# Patient Record
Sex: Female | Born: 1985 | Race: White | Hispanic: No | Marital: Married | Smoking: Former smoker
Health system: Southern US, Community
[De-identification: ages and names within clinical notes are randomized; demographics above are authoritative.]

## PROBLEM LIST (undated history)

## (undated) DIAGNOSIS — E079 Disorder of thyroid, unspecified: Secondary | ICD-10-CM

## (undated) DIAGNOSIS — Z30430 Encounter for insertion of intrauterine contraceptive device: Secondary | ICD-10-CM

## (undated) DIAGNOSIS — E119 Type 2 diabetes mellitus without complications: Secondary | ICD-10-CM

## (undated) HISTORY — PX: NO PAST SURGERIES: SHX2092

## (undated) HISTORY — DX: Encounter for insertion of intrauterine contraceptive device: Z30.430

---
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
## 2007-07-25 ENCOUNTER — Ambulatory Visit: Payer: Self-pay | Admitting: Internal Medicine

## 2008-01-18 ENCOUNTER — Ambulatory Visit: Payer: Self-pay | Admitting: Family Medicine

## 2008-08-05 ENCOUNTER — Ambulatory Visit: Payer: Self-pay | Admitting: Internal Medicine

## 2008-08-05 IMAGING — CR DG CHEST 2V
1 series · 2 of 2 positions shown · non-contrast
Comparison: none

REASON FOR EXAM: Persistent cough. > 2 weeks
COMMENTS:

[Series 1: view not recorded · 0.17mm/px · 2 of 2 slices shown]
[im 1/2]
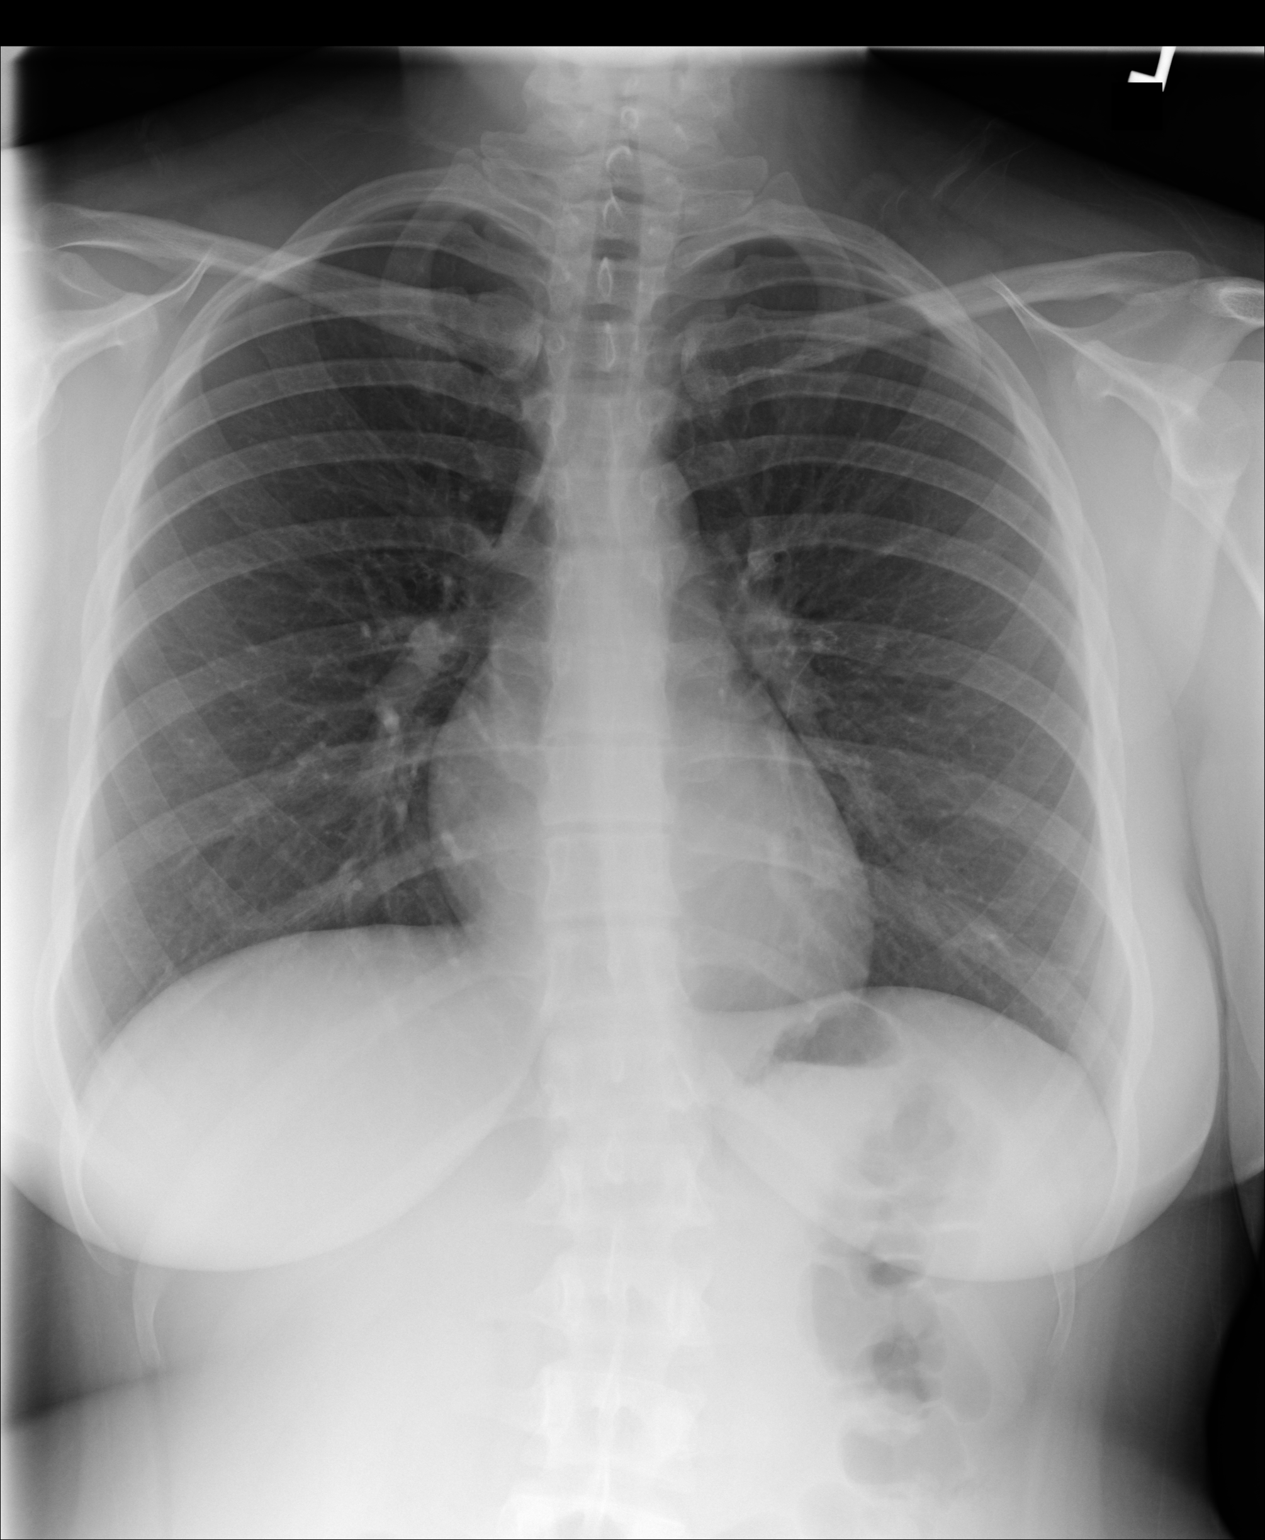
[im 2/2]
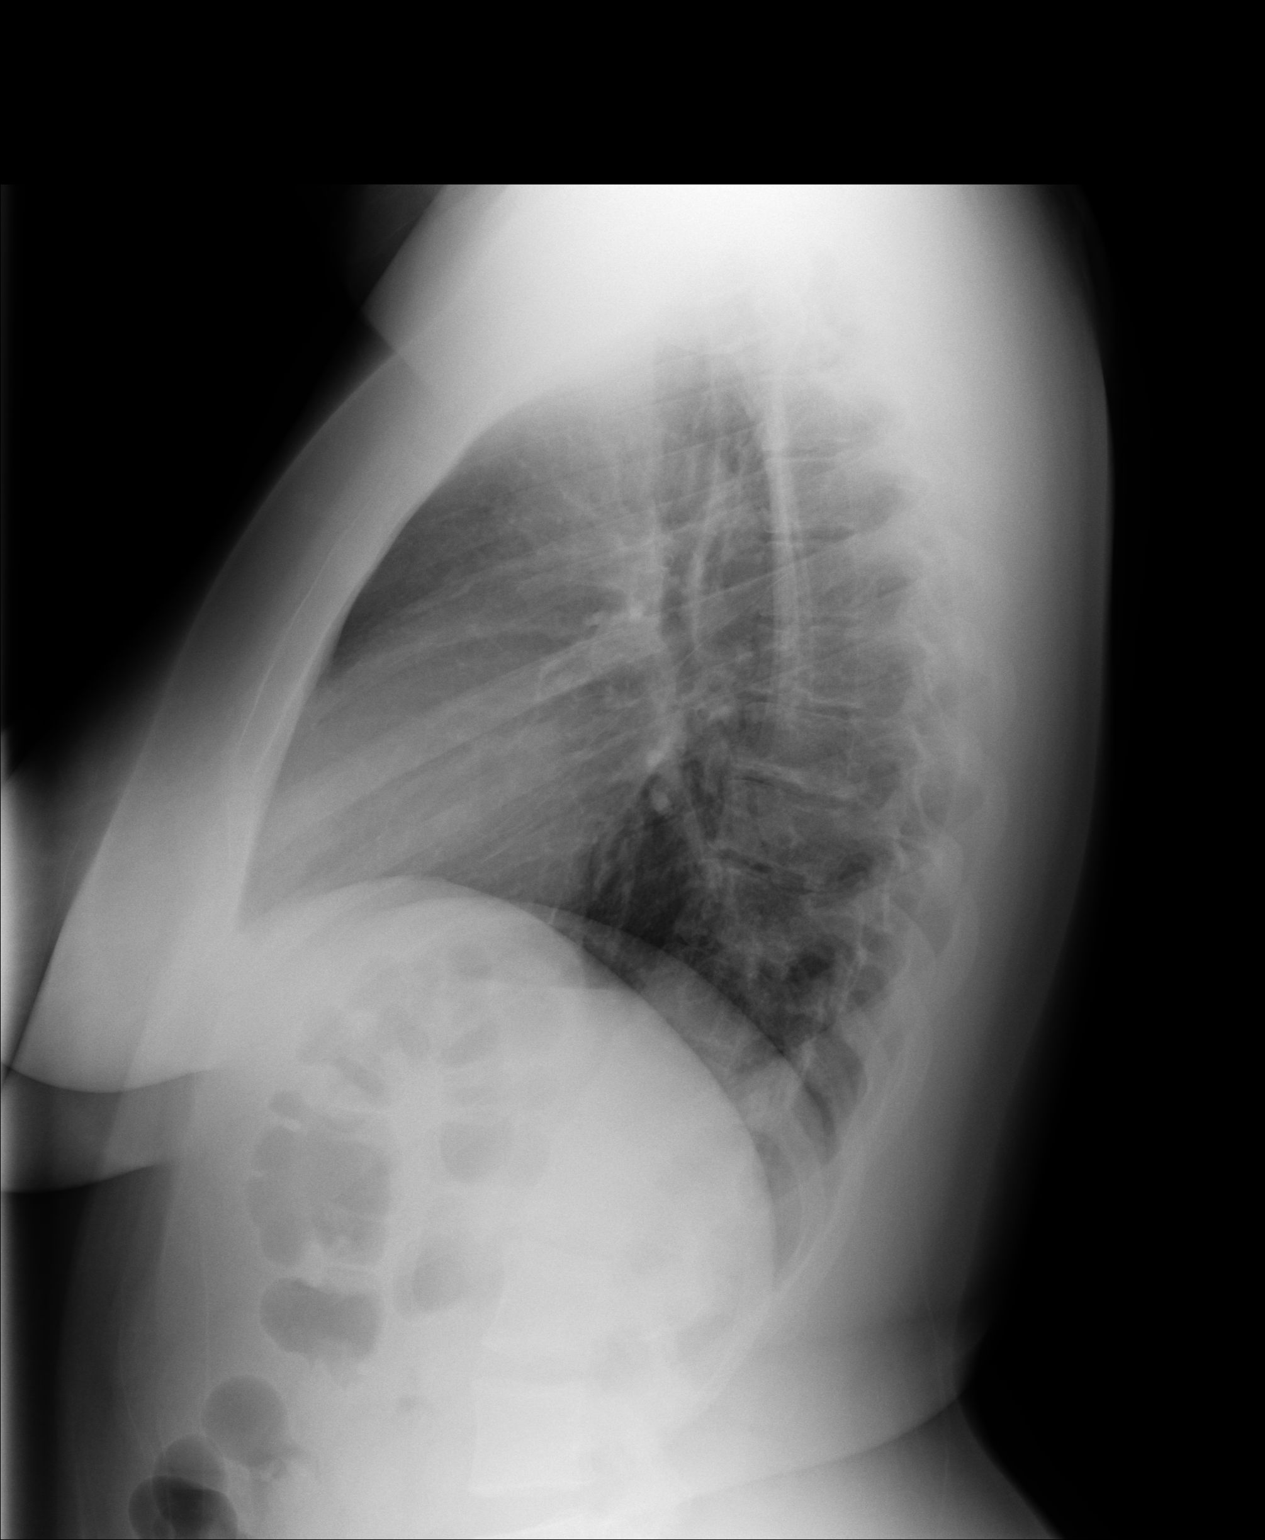

[2 of 2 positions shown; findings below may reference images not displayed]

PROCEDURE:     MDR - MDR CHEST PA(OR AP) AND LATERAL  - [DATE] [DATE]

RESULT:     There is no previous exam for comparison.

The lungs are clear. The heart and pulmonary vessels are normal. The bony
and mediastinal structures are unremarkable. There is no effusion. There is
no pneumothorax or evidence of congestive failure.
IMPRESSION: No acute cardiopulmonary disease.

## 2011-06-25 DIAGNOSIS — Z30430 Encounter for insertion of intrauterine contraceptive device: Secondary | ICD-10-CM

## 2011-06-25 HISTORY — DX: Encounter for insertion of intrauterine contraceptive device: Z30.430

## 2011-09-16 DIAGNOSIS — G43909 Migraine, unspecified, not intractable, without status migrainosus: Secondary | ICD-10-CM | POA: Insufficient documentation

## 2011-09-16 DIAGNOSIS — E119 Type 2 diabetes mellitus without complications: Secondary | ICD-10-CM | POA: Insufficient documentation

## 2011-09-16 DIAGNOSIS — E039 Hypothyroidism, unspecified: Secondary | ICD-10-CM | POA: Insufficient documentation

## 2011-09-16 DIAGNOSIS — K219 Gastro-esophageal reflux disease without esophagitis: Secondary | ICD-10-CM | POA: Insufficient documentation

## 2011-09-19 DIAGNOSIS — F909 Attention-deficit hyperactivity disorder, unspecified type: Secondary | ICD-10-CM | POA: Insufficient documentation

## 2013-04-13 ENCOUNTER — Emergency Department: Payer: Self-pay | Admitting: Emergency Medicine

## 2014-07-14 ENCOUNTER — Emergency Department: Payer: Self-pay | Admitting: Emergency Medicine

## 2014-07-14 LAB — COMPREHENSIVE METABOLIC PANEL
ANION GAP: 8 (ref 7–16)
AST: 17 U/L (ref 15–37)
Albumin: 3.6 g/dL (ref 3.4–5.0)
Alkaline Phosphatase: 101 U/L
BUN: 12 mg/dL (ref 7–18)
Bilirubin,Total: 0.3 mg/dL (ref 0.2–1.0)
CHLORIDE: 103 mmol/L (ref 98–107)
CO2: 24 mmol/L (ref 21–32)
Calcium, Total: 8.4 mg/dL — ABNORMAL LOW (ref 8.5–10.1)
Creatinine: 0.69 mg/dL (ref 0.60–1.30)
EGFR (African American): >60
EGFR (Non-African Amer.): >60
Glucose: 328 mg/dL — ABNORMAL HIGH (ref 65–99)
Osmolality: 283 (ref 275–301)
Potassium: 4 mmol/L (ref 3.5–5.1)
SGPT (ALT): 24 U/L
Sodium: 135 mmol/L — ABNORMAL LOW (ref 136–145)
Total Protein: 6.7 g/dL (ref 6.4–8.2)

## 2014-07-14 LAB — CBC WITH DIFFERENTIAL/PLATELET
BASOS ABS: 0.1 10*3/uL (ref 0.0–0.1)
BASOS PCT: 0.8 %
EOS ABS: 0.3 10*3/uL (ref 0.0–0.7)
Eosinophil %: 4.2 %
HCT: 42.3 % (ref 35.0–47.0)
HGB: 14.1 g/dL (ref 12.0–16.0)
Lymphocyte #: 2.4 10*3/uL (ref 1.0–3.6)
Lymphocyte %: 34.4 %
MCH: 31.8 pg (ref 26.0–34.0)
MCHC: 33.4 g/dL (ref 32.0–36.0)
MCV: 95 fL (ref 80–100)
MONO ABS: 0.9 x10 3/mm (ref 0.2–0.9)
Monocyte %: 12.4 %
NEUTROS PCT: 48.2 %
Neutrophil #: 3.4 10*3/uL (ref 1.4–6.5)
Platelet: 290 10*3/uL (ref 150–440)
RBC: 4.45 10*6/uL (ref 3.80–5.20)
RDW: 12.6 % (ref 11.5–14.5)
WBC: 7 10*3/uL (ref 3.6–11.0)

## 2014-07-14 LAB — URINALYSIS, COMPLETE
Bacteria: NONE SEEN
Bilirubin,UR: NEGATIVE
Blood: NEGATIVE
Glucose,UR: >=500 mg/dL (ref 0–75)
Leukocyte Esterase: NEGATIVE
NITRITE: NEGATIVE
PROTEIN: NEGATIVE
Ph: 6 (ref 4.5–8.0)
RBC,UR: 1 /HPF (ref 0–5)
Specific Gravity: 1.039 (ref 1.003–1.030)
Squamous Epithelial: 2
WBC UR: <1 /HPF (ref 0–5)

## 2014-07-16 LAB — BETA STREP CULTURE(ARMC)

## 2014-07-28 ENCOUNTER — Emergency Department: Payer: Self-pay | Admitting: Emergency Medicine

## 2016-06-20 ENCOUNTER — Ambulatory Visit (INDEPENDENT_AMBULATORY_CARE_PROVIDER_SITE_OTHER): Payer: BLUE CROSS/BLUE SHIELD

## 2016-06-20 ENCOUNTER — Encounter: Payer: Self-pay | Admitting: *Deleted

## 2016-06-20 ENCOUNTER — Ambulatory Visit
Admission: EM | Admit: 2016-06-20 | Discharge: 2016-06-20 | Disposition: A | Discharge status: Home / Self Care | Payer: BLUE CROSS/BLUE SHIELD

## 2016-06-20 DIAGNOSIS — R300 Dysuria: Secondary | ICD-10-CM | POA: Diagnosis not present

## 2016-06-20 DIAGNOSIS — M549 Dorsalgia, unspecified: Secondary | ICD-10-CM

## 2016-06-20 DIAGNOSIS — J40 Bronchitis, not specified as acute or chronic: Secondary | ICD-10-CM | POA: Diagnosis not present

## 2016-06-20 HISTORY — DX: Disorder of thyroid, unspecified: E07.9

## 2016-06-20 HISTORY — DX: Type 2 diabetes mellitus without complications: E11.9

## 2016-06-20 LAB — CBC WITH DIFFERENTIAL/PLATELET
Basophils Absolute: 0.1 10*3/uL (ref 0–0.1)
Basophils Relative: 1 %
EOS PCT: 3 %
Eosinophils Absolute: 0.2 10*3/uL (ref 0–0.7)
HCT: 43.4 % (ref 35.0–47.0)
Hemoglobin: 14.8 g/dL (ref 12.0–16.0)
LYMPHS ABS: 2.3 10*3/uL (ref 1.0–3.6)
LYMPHS PCT: 26 %
MCH: 31.7 pg (ref 26.0–34.0)
MCHC: 34.1 g/dL (ref 32.0–36.0)
MCV: 93 fL (ref 80.0–100.0)
MONO ABS: 0.7 10*3/uL (ref 0.2–0.9)
Monocytes Relative: 8 %
Neutro Abs: 5.5 10*3/uL (ref 1.4–6.5)
Neutrophils Relative %: 62 %
PLATELETS: 324 10*3/uL (ref 150–440)
RBC: 4.66 MIL/uL (ref 3.80–5.20)
RDW: 13 % (ref 11.5–14.5)
WBC: 8.8 10*3/uL (ref 3.6–11.0)

## 2016-06-20 LAB — URINALYSIS, COMPLETE (UACMP) WITH MICROSCOPIC
BILIRUBIN URINE: NEGATIVE
GLUCOSE, UA: 500 mg/dL — AB
KETONES UR: NEGATIVE mg/dL
Leukocytes, UA: NEGATIVE
Nitrite: NEGATIVE
PROTEIN: NEGATIVE mg/dL
Specific Gravity, Urine: 1.015 (ref 1.005–1.030)
pH: 6.5 (ref 5.0–8.0)

## 2016-06-20 LAB — BASIC METABOLIC PANEL
Anion gap: 9 (ref 5–15)
BUN: 10 mg/dL (ref 6–20)
CALCIUM: 9.4 mg/dL (ref 8.9–10.3)
CO2: 22 mmol/L (ref 22–32)
Chloride: 101 mmol/L (ref 101–111)
Creatinine, Ser: 0.61 mg/dL (ref 0.44–1.00)
GFR calc Af Amer: >60 mL/min (ref >60)
GLUCOSE: 206 mg/dL — AB (ref 65–99)
Potassium: 3.8 mmol/L (ref 3.5–5.1)
Sodium: 132 mmol/L — ABNORMAL LOW (ref 135–145)

## 2016-06-20 LAB — FIBRIN DERIVATIVES D-DIMER (ARMC ONLY): FIBRIN DERIVATIVES D-DIMER (ARMC): 120.63 (ref 0–499)

## 2016-06-20 LAB — RAPID STREP SCREEN (MED CTR MEBANE ONLY): Streptococcus, Group A Screen (Direct): NEGATIVE

## 2016-06-20 LAB — RAPID INFLUENZA A&B ANTIGENS
Influenza A (ARMC): NEGATIVE
Influenza B (ARMC): NEGATIVE

## 2016-06-20 LAB — PREGNANCY, URINE: Preg Test, Ur: NEGATIVE

## 2016-06-20 IMAGING — CR DG CHEST 2V
2 series · 2 of 2 positions shown · non-contrast
Comparison: [DATE]

CLINICAL DATA: Cough 1 month

EXAM:
CHEST  2 VIEW

[chest pa]
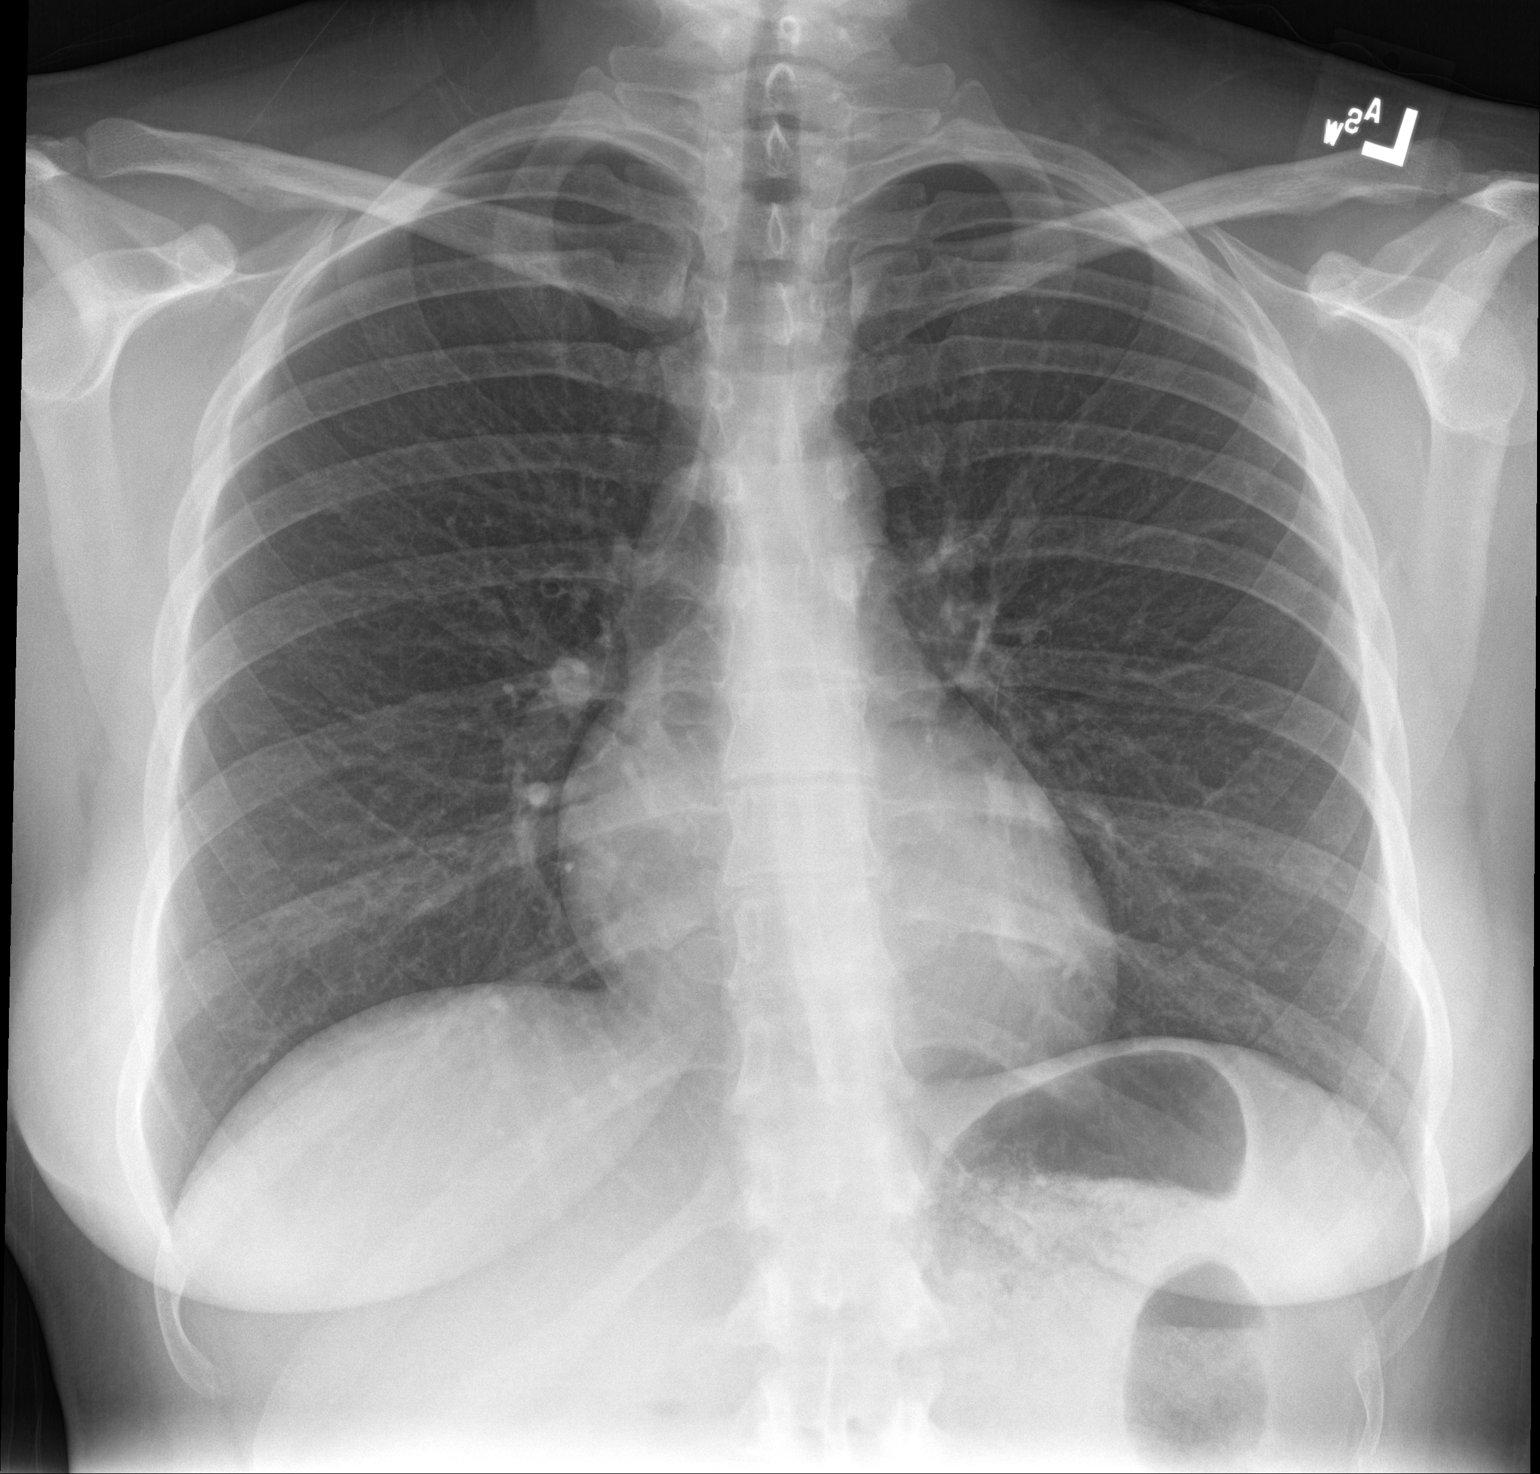

[chest lat]
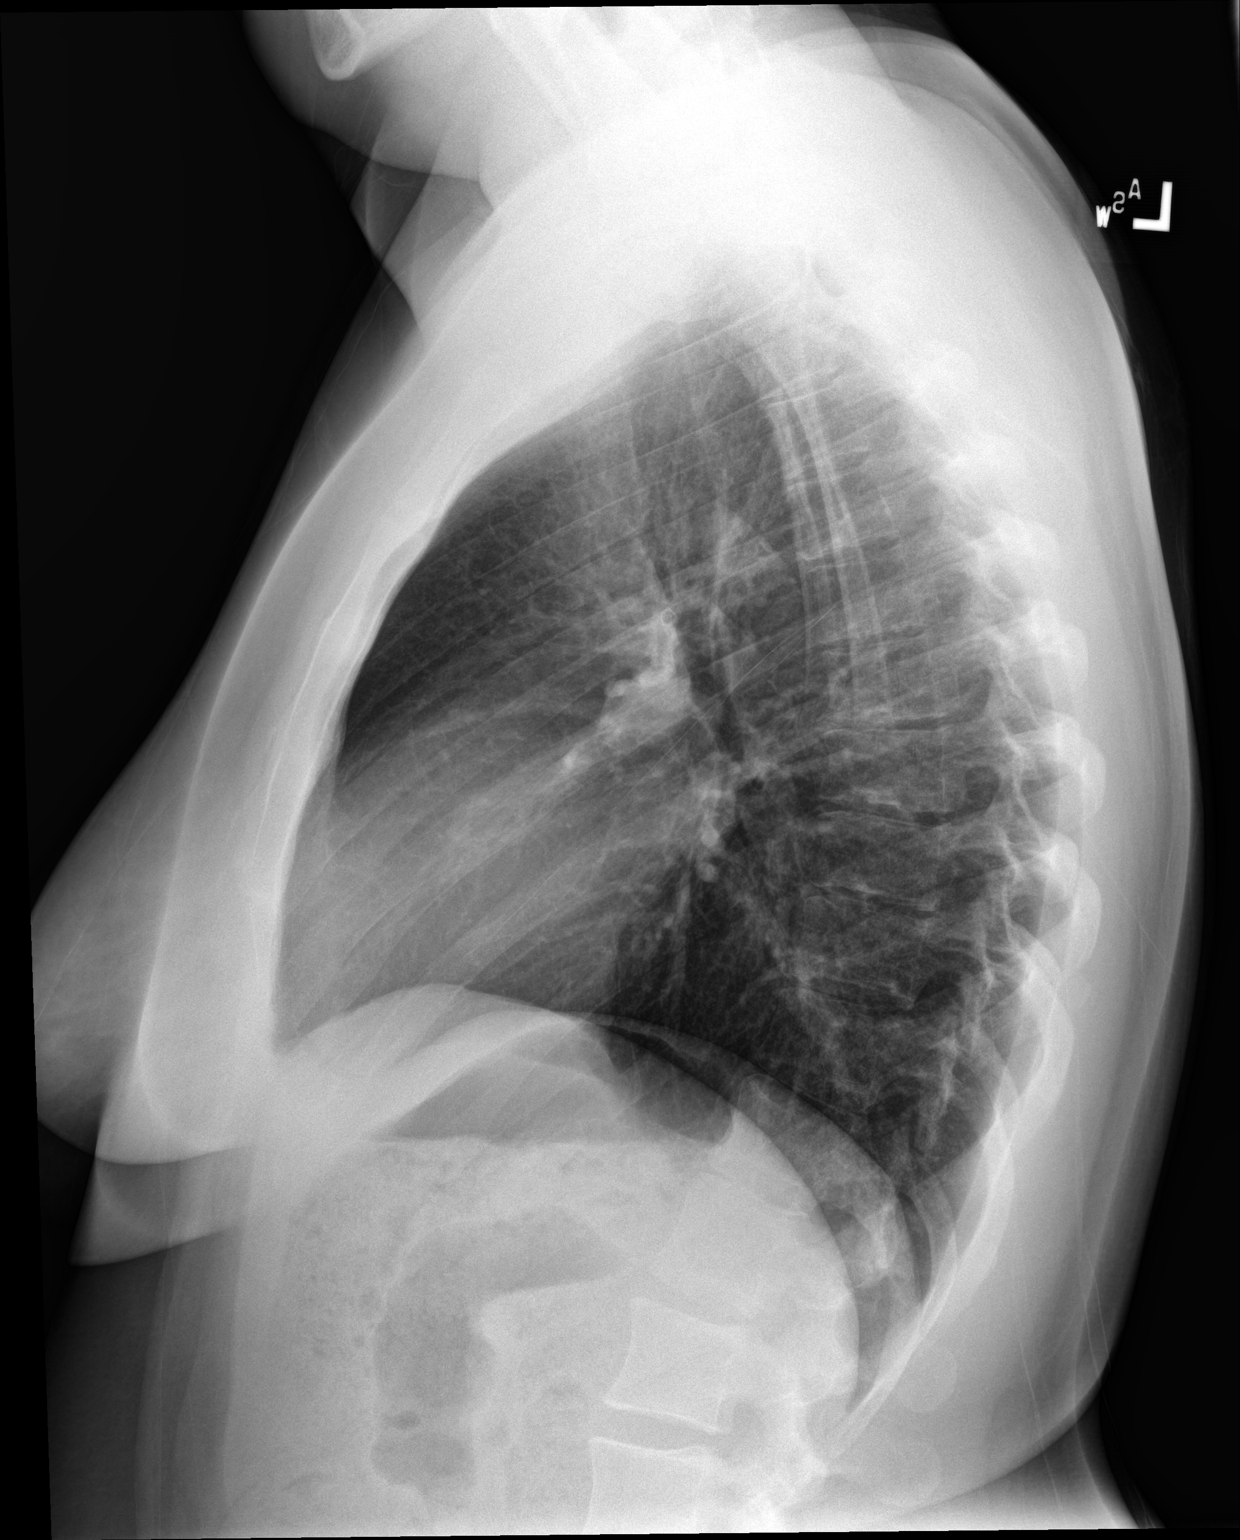

[2 of 2 positions shown; findings below may reference images not displayed]

FINDINGS: The heart size and mediastinal contours are within normal limits.
Both lungs are clear. The visualized skeletal structures are
unremarkable.
IMPRESSION: No active cardiopulmonary disease.

## 2016-06-20 MED ORDER — IPRATROPIUM-ALBUTEROL 0.5-2.5 (3) MG/3ML IN SOLN
3.0000 mL | Freq: Once | RESPIRATORY_TRACT | Status: AC
  Administered 2016-06-20: 3 mL via RESPIRATORY_TRACT

## 2016-06-20 MED ORDER — PREDNISONE 10 MG PO TABS: ORAL_TABLET | ORAL | 0 refills | Status: DC

## 2016-06-20 MED ORDER — ALBUTEROL SULFATE HFA 108 (90 BASE) MCG/ACT IN AERS: 2.0000 | INHALATION_SPRAY | RESPIRATORY_TRACT | 0 refills | Status: DC | PRN

## 2016-06-20 MED ORDER — SULFAMETHOXAZOLE-TRIMETHOPRIM 800-160 MG PO TABS: 1.0000 | ORAL_TABLET | Freq: Two times a day (BID) | ORAL | 0 refills | Status: AC

## 2016-06-20 MED ORDER — FLUCONAZOLE 150 MG PO TABS
150.0000 mg | ORAL_TABLET | Freq: Every day | ORAL | 0 refills | Status: DC
Start: 2016-06-20 — End: 2017-04-16

## 2016-06-20 MED ORDER — BENZONATATE 100 MG PO CAPS: 100.0000 mg | ORAL_CAPSULE | Freq: Three times a day (TID) | ORAL | 0 refills | Status: DC | PRN

## 2016-06-20 NOTE — Discharge Instructions (Signed)
Take medication as prescribed. Rest. Drink plenty of fluids.  ° °Follow up with your primary care physician this week. Return to Urgent care for new or worsening concerns.  ° °

## 2016-06-20 NOTE — ED Triage Notes (Signed)
Cough 1 month ago and seen at [facility] and had a CXR done, rx amoxicillin. Cough has persisted and symptoms have progressed. Now c/o productive cough- green, fever, back pain with coughing and resp., runny nose, head congestion, chills body aches.

## 2016-06-20 NOTE — ED Provider Notes (Signed)
[facility] ____________________________________________  Time seen: Approximately 4:08 PM  I have reviewed the triage vital signs and the nursing notes.   HISTORY  Chief Complaint Cough; Fever; and Back Pain  HPI  Melissa Terrell is a 30 y.o. female presents with mother at bedside for the complaints of 4 weeks of cough. Patient reports for the last 4 weeks she has had an ongoing cough and chest congestion with intermittent wheezing. Patient reports that she was initially seen by her primary doctor and diagnosed with bronchitis after approximately 1.5 weeks of symptoms, started on azithromycin. Patient reports symptoms have been continued and was seen at [facility] ER. Patient reports a that time in the ER she had a negative chest x-ray and was then sent home without any further medications. Patient reports cough has continued. Patient also reports for the last 3 days she has had increased nasal congestion, sore throat, chills and report of subjective low-grade fevers. Patient reports a the last week she has been exposed to family members that had the flu. Patient reports she feels that she is working with 2 separate illnesses.  Patient reports she has been having right-sided back pain for the last few weeks. Denies fall or injury. Denies change back pain with movement. States back pain is present with taking deep breaths but also at rest. Denies history of PE or DVT.   States cough is primarily a dry hacking cough with intermittent wheezing. Patient reports that she is a smoker and does have some intermittent wheezing when coughing. Denies current shortness of breath. Denies chest pain. Denies abdominal pain, dysuria, extremity pain, extremity swelling, dizziness, weakness or trauma. Denies recent surgeries, hospitalizations, long trips, or immobilization. Reports has an IUD. Declines chance of pregnancy.  [facility]: PCP   Past Medical History:  Diagnosis Date  .  Diabetes mellitus without complication (HCC)   . Thyroid disease   Patient reports type II diabetic. States that she was initially on metformin, but reports now only on insulin. Reports on Humalog. Patient reports blood sugars have been recently fluctuating, reports blood sugar yesterday before eating was 139.  There are no active problems to display for this patient.   History reviewed. No pertinent surgical history.  Current Outpatient Rx  . Order #: [id number] Class: Historical Med  . Order #: [id number] Class: Historical Med  . Order #: [id number] Class: Normal  . Order #: [id number] Class: Normal  . Order #: [id number] Class: Normal  . Order #: [id number] Class: Normal  . Order #: [id number] Class: Normal    No current facility-administered medications for this encounter.   Current Outpatient Prescriptions:  .  insulin lispro (HUMALOG) 100 UNIT/ML injection, Inject 5 Units into the skin 3 (three) times daily before meals., Disp: , Rfl:  .  levothyroxine (SYNTHROID, LEVOTHROID) 50 MCG tablet, Take 50 mcg by mouth daily before breakfast., Disp: , Rfl:  .  albuterol (PROVENTIL HFA;VENTOLIN HFA) 108 (90 Base) MCG/ACT inhaler, Inhale 2 puffs into the lungs every 4 (four) hours as needed., Disp: 1 Inhaler, Rfl: 0 .  benzonatate (TESSALON PERLES) 100 MG capsule, Take 1 capsule (100 mg total) by mouth 3 (three) times daily as needed., Disp: 15 capsule, Rfl: 0 .  fluconazole (DIFLUCAN) 150 MG tablet, Take 1 tablet (150 mg total) by mouth daily. Take one pill orally, then Repeat in 72 hours as needed., Disp: 2 tablet, Rfl: 0 .  predniSONE (DELTASONE) 10 MG tablet, Start 60 mg po day one, then 50 mg po day two, taper by  10 mg daily until complete., Disp: 21 tablet, Rfl: 0 .  sulfamethoxazole-trimethoprim (BACTRIM DS,SEPTRA DS) 800-160 MG tablet, Take 1 tablet by mouth 2 (two) times daily., Disp: 20 tablet, Rfl: 0  Allergies Penicillins  family history Grandfather: "heart history and blood clot in  heart"  Social History Social History  Substance Use Topics  . Smoking status: Current Every Day Smoker  . Smokeless tobacco: Never Used  . Alcohol use No    Review of Systems Constitutional: As above. Eyes: No visual changes. ENT:As above. Cardiovascular: Denies chest pain. Respiratory: Denies shortness of breath. Gastrointestinal: No abdominal pain.  No nausea, no vomiting.  No diarrhea.  No constipation. Genitourinary: Negative for dysuria. Musculoskeletal: Negative for back pain. Skin: Negative for rash. Neurological: Negative for headaches, focal weakness or numbness.  10-point ROS otherwise negative.  ____________________________________________   PHYSICAL EXAM:  VITAL SIGNS: ED Triage Vitals  Enc Vitals Group     BP 06/20/16 1517 130/70     Pulse Rate 06/20/16 1517 81     Resp 06/20/16 1517 16     Temp 06/20/16 1517 97.9 F (36.6 C)     Temp Source 06/20/16 1517 Oral     SpO2 06/20/16 1517 100 %     Weight 06/20/16 1518 159 lb (72.1 kg)     Height 06/20/16 1518 5\' 4"  (1.626 m)     Head Circumference --      Peak Flow --      Pain Score --      Pain Loc --      Pain Edu? --      Excl. in GC? --    Today's Vitals   06/20/16 1517 06/20/16 1518 06/20/16 1824 06/20/16 1825  BP: 130/70   110/76  Pulse: 81   84  Resp: 16     Temp: 97.9 F (36.6 C)     TempSrc: Oral     SpO2: 100%   99%  Weight:  159 lb (72.1 kg)    Height:  5\' 4"  (1.626 m)    PainSc:   8       Constitutional: Alert and oriented. Well appearing and in no acute distress. Eyes: Conjunctivae are normal. PERRL. EOMI. Head: Atraumatic. No sinus tenderness to palpation. No swelling. No erythema.  Ears: no erythema, normal TMs bilaterally.   Nose:Nasal congestion with clear rhinorrhea  Mouth/Throat: Mucous membranes are moist. Mild pharyngeal erythema. No tonsillar swelling or exudate.  Neck: No stridor.  No cervical spine tenderness to palpation. Hematological/Lymphatic/Immunilogical: No  cervical lymphadenopathy. Cardiovascular: Normal rate, regular rhythm. Grossly normal heart sounds.  Good peripheral circulation. Respiratory: Normal respiratory effort.  No retractions. Mild scattered rhonchi. No wheezes or rales. Good air movement. Dry intermittent cough noted in room with mild bronchospasm knees. Gastrointestinal: Soft and nontender. Normal Bowel sounds. No CVA tenderness. Musculoskeletal: No lower or upper extremity tenderness nor edema. No midline cervical, thoracic or lumbar tenderness to palpation. Patient does have mild tenderness to palpation right upper parathoracic with mild increased pain with twisting, no swelling, no ecchymosis. Neurologic:  Normal speech and language. No gait instability. Skin:  Skin is warm, dry and intact. No rash noted. Psychiatric: Mood and affect are normal. Speech and behavior are normal.   ___________________________________________   LABS (all labs ordered are listed, but only abnormal results are displayed)  Labs Reviewed  BASIC METABOLIC PANEL - Abnormal; Notable for the following:       Result Value   Sodium 132 (*)  Glucose, Bld 206 (*)    All other components within normal limits  URINALYSIS, COMPLETE (UACMP) WITH MICROSCOPIC - Abnormal; Notable for the following:    Glucose, UA 500 (*)    Hgb urine dipstick TRACE (*)    Squamous Epithelial / LPF 0-5 (*)    Bacteria, UA FEW (*)    All other components within normal limits  RAPID INFLUENZA A&B ANTIGENS ([facility] ONLY)  RAPID STREP SCREEN (NOT AT [facility])  CULTURE, GROUP A STREP ([facility])  URINE CULTURE  FIBRIN DERIVATIVES D-DIMER ([facility] ONLY)  CBC WITH DIFFERENTIAL/PLATELET  PREGNANCY, URINE    RADIOLOGY  Dg Chest 2 View  Result Date: 06/20/2016 CLINICAL DATA:  Cough 1 month EXAM: CHEST  2 VIEW COMPARISON:  08/05/2008 FINDINGS: The heart size and mediastinal contours are within normal limits. Both lungs are clear. The visualized skeletal structures are unremarkable.  IMPRESSION: No active cardiopulmonary disease. Electronically Signed   By: Marlan Palau M.D.   On: 06/20/2016 17:31   ____________________________________________   PROCEDURES Procedures     INITIAL IMPRESSION / ASSESSMENT AND PLAN / ED COURSE  Pertinent labs & imaging results that were available during my care of the patient were reviewed by me and considered in my medical decision making (see chart for details).  Overall well-appearing patient. No acute distress. Vital signs stable. No tachycardia or tachypnea. Patient with ongoing cough for the last several weeks, discussed concern for bronchitis, however patient is reporting pain with deep breath to right back discuss evaluation of PE. No bilateral lower extremity tenderness, swelling or appearance of DVT. Bilateral pedal pulses equal and easily palpated. No tachycardia, pulse ox 100%. Patient also reporting change of symptoms over the last several days after being exposed to influenza from family members. Discussed with patient in detail will evaluate for influenza, strep, CBC, BMP and d-dimer. Will defer chest x-ray at this time until d-dimer returned, as discussed with patient if d-dimer elevated patient will then be further evaluated and have CT of chest. Patient and mother verbalized understanding and agree to this plan.  Laboratory results reviewed with patient. The patient later reported that she felt like she was urinating more frequently with a slight burning sensation, evaluated urinalysis. Urinalysis showing few bacteria as well as yeast present. Discussed with patient monitoring glucose closely as possible contributor. Patient reports that she does use a sliding scale intermittently. Will culture urine, but concern for possible urinary tract infection. Chest x-ray negative for active cardiopulmonary disease per radiologist. As patient has can had continued cough with intermittent wheezing and rhonchi present, suspect bronchitis.  After neb, wheezes fully resolved, patient reports feeling better.  Encouraged stop smoking. Patient penicillin allergic, recent treatment with azithromycin, will treat patient with oral Bactrim to cover concern of UTI as well as bronchitis, prednisone taper, when necessary albuterol inhaler, Tessalon Perles and Diflucan for yeast noted. Discussed monitoring blood sugars closely. Reports good results in past with prednisone for similar symptoms. Encourage rest, fluids and discussed need for close PCP follow-up. Patient states that she will be following up with her primary care physician as scheduled tomorrow.  Discussed follow up with Primary care physician this week. Discussed follow up and return parameters including no resolution or any worsening concerns. Patient verbalized understanding and agreed to plan.   ____________________________________________   FINAL CLINICAL IMPRESSION(S) / ED DIAGNOSES  Final diagnoses:  Bronchitis  Dysuria  Acute right-sided back pain, unspecified back location     Discharge Medication List as of 06/20/2016  6:20  PM    START taking these medications   Details  albuterol (PROVENTIL HFA;VENTOLIN HFA) 108 (90 Base) MCG/ACT inhaler Inhale 2 puffs into the lungs every 4 (four) hours as needed., Starting Thu 06/20/2016, Normal    benzonatate (TESSALON PERLES) 100 MG capsule Take 1 capsule (100 mg total) by mouth 3 (three) times daily as needed., Starting Thu 06/20/2016, Normal    fluconazole (DIFLUCAN) 150 MG tablet Take 1 tablet (150 mg total) by mouth daily. Take one pill orally, then Repeat in 72 hours as needed., Starting Thu 06/20/2016, Normal    predniSONE (DELTASONE) 10 MG tablet Start 60 mg po day one, then 50 mg po day two, taper by 10 mg daily until complete., Normal    sulfamethoxazole-trimethoprim (BACTRIM DS,SEPTRA DS) 800-160 MG tablet Take 1 tablet by mouth 2 (two) times daily., Starting Thu 06/20/2016, Until Sun 06/30/2016, Normal         Note: This dictation was prepared with Dragon dictation along with smaller phrase technology. Any transcriptional errors that result from this process are unintentional.    Clinical Course       Renford Dills, NP 06/20/16 [phone]

## 2016-06-22 LAB — URINE CULTURE

## 2016-06-23 LAB — CULTURE, GROUP A STREP (THRC)

## 2017-04-16 ENCOUNTER — Encounter: Payer: Self-pay | Admitting: Maternal Newborn

## 2017-04-16 ENCOUNTER — Ambulatory Visit (INDEPENDENT_AMBULATORY_CARE_PROVIDER_SITE_OTHER): Payer: BLUE CROSS/BLUE SHIELD | Admitting: Maternal Newborn

## 2017-04-16 ENCOUNTER — Telehealth: Payer: Self-pay | Admitting: Advanced Practice Midwife

## 2017-04-16 VITALS — BP 110/62 | HR 92 | Ht 64.0 in | Wt 154.0 lb

## 2017-04-16 DIAGNOSIS — Z124 Encounter for screening for malignant neoplasm of cervix: Secondary | ICD-10-CM | POA: Diagnosis not present

## 2017-04-16 DIAGNOSIS — Z01419 Encounter for gynecological examination (general) (routine) without abnormal findings: Secondary | ICD-10-CM | POA: Diagnosis not present

## 2017-04-16 NOTE — Progress Notes (Signed)
Gynecology Annual Exam  PCP: [facility]  Chief Complaint:  Chief Complaint  Patient presents with  . Gynecologic Exam    History of Present Illness: Patient is a 31 y.o. G1P0101 presents for annual exam. The patient has no complaints today.   LMP: No LMP recorded. Patient is not currently having periods (Reason: IUD). Postcoital Bleeding: no Dysmenorrhea: no  The patient is sexually active. She currently uses IUD for contraception. She denies dyspareunia.  The patient occasionally performs self breast exams.  There is notable family history of breast or ovarian cancer in her family.  The patient wears seatbelts: yes.   The patient has regular exercise: yes.    The patient reports current symptoms of depression.    Review of Systems: Review of Systems  Constitutional: Negative for chills, fever and weight loss.  HENT: Negative for congestion, sinus pain and sore throat.   Eyes: Negative.   Respiratory: Positive for cough. Negative for shortness of breath and wheezing.   Cardiovascular: Negative for chest pain and palpitations.  Gastrointestinal: Positive for heartburn and nausea. Negative for abdominal pain and diarrhea.  Genitourinary: Negative for dysuria, frequency and urgency.  Skin: Negative.   Neurological: Negative for dizziness, sensory change and headaches.  Endo/Heme/Allergies: Negative.   Psychiatric/Behavioral: Positive for depression. Negative for substance abuse and suicidal ideas. The patient is nervous/anxious.   All other systems reviewed and are negative.   Past Medical History:  Past Medical History:  Diagnosis Date  . Diabetes mellitus without complication ([place])   . Encounter for insertion of mirena IUD 2013  . Thyroid disease     Past Surgical History:  History reviewed. No pertinent surgical history.  Gynecologic History:  No LMP recorded. Patient is not currently having periods (Reason: IUD). Contraception: IUD Last Pap: Patient  does not remember date, reports that results were: no abnormalities.  Obstetric History: G1P0101  Family History:  Family History  Problem Relation Age of Onset  . Ovarian cancer Other     Social History:  Social History   Social History  . Marital status: Married    Spouse name: N/A  . Number of children: N/A  . Years of education: N/A   Occupational History  . Not on file.   Social History Main Topics  . Smoking status: Current Every Day Smoker  . Smokeless tobacco: Never Used  . Alcohol use No  . Drug use: No  . Sexual activity: Yes    Birth control/ protection: IUD   Other Topics Concern  . Not on file   Social History Narrative  . No narrative on file    Allergies:  Allergies  Allergen Reactions  . Penicillins Anaphylaxis    Medications: Prior to Admission medications   Medication Sig Start Date End Date Taking? Authorizing Provider  Blood Glucose Monitoring Suppl (FIFTY50 GLUCOSE METER 2.0) w/Device KIT Use as instructed diabetes 250.00 04/05/14  Yes [provider]  escitalopram (LEXAPRO) 10 MG tablet TAKE 1/2 (ONE-HALF) TABLET BY MOUTH ONCE DAILY FOR 7 DAYS THEN  ONE  TABLET  DAILY  THEREAFTER 03/04/17  Yes [provider]  Insulin Glargine (LANTUS SOLOSTAR) 100 UNIT/ML Solostar Pen Inject into the skin. 03/31/17 03/31/18 Yes [provider]  insulin lispro (HUMALOG) 100 UNIT/ML injection Inject 5 Units into the skin 3 (three) times daily before meals.   Yes [provider]  Insulin Pen Needle (UNIFINE PENTIPS) 31G X 8 MM MISC Use for insulin pen 4 times daily E11.65  07/19/14  Yes [provider]  levonorgestrel (MIRENA) 20 MCG/24HR IUD Frequency:UNKNOWN   Dosage:0.0     Instructions:  Note:Dose: 1 10/15/11  Yes [provider]  levothyroxine (SYNTHROID, LEVOTHROID) 50 MCG tablet Take 50 mcg by mouth daily before breakfast.   Yes [provider]  metFORMIN (GLUCOPHAGE) 500 MG tablet Take 500 mg by  mouth. 03/31/17 03/31/18 Yes [provider]    Physical Exam Vitals: Blood pressure 110/62, pulse 92, height '5\' 4"'  (1.626 m), weight 154 lb (69.9 kg).  General: NAD HEENT: normocephalic, anicteric Thyroid: no enlargement, no palpable nodules Pulmonary: No increased work of breathing, CTAB Cardiovascular: RRR, distal pulses 2+ Breast: Breasts symmetrical, no tenderness, no palpable nodules or masses, no skin or nipple retraction present, no nipple discharge.  No axillary or supraclavicular lymphadenopathy. Small, nontender skin-colored nodule under left armpit that appears to be an ingrown hair. Abdomen: NABS, soft, non-tender, non-distended.  Umbilicus without lesions.  No hepatomegaly, splenomegaly or masses palpable. No evidence of hernia  Genitourinary:  External: Normal external female genitalia.  Normal urethral meatus, normal  Bartholin's and Skene's glands.    Vagina: Normal vaginal mucosa, no evidence of prolapse.    Cervix: Grossly normal in appearance, no bleeding, IUD strings visible.  Uterus: Non-enlarged, mobile, normal contour.  No CMT  Adnexa: ovaries non-enlarged, no adnexal masses  Rectal: deferred  Lymphatic: no evidence of inguinal lymphadenopathy Extremities: no edema, erythema, or tenderness Neurologic: Grossly intact Psychiatric: mood appropriate, affect full  PHQ-9: 3 GAD 7: 2  Assessment: 31 y.o. G1P0101 routine annual exam.  Plan: Problem List Items Addressed This Visit    Encounter for annual routine gynecological examination - Primary    Other Visit Diagnoses    Pap smear for cervical cancer screening       Relevant Orders   Pap IG w/ reflex to HPV when ASC-U      1) STI screening was offered and declined.  2) ASCCP guidelines and rationale discussed.  Patient opts for every 3 year screening interval.  3) Contraception - Education given regarding options for contraception. Patient desires Mirena IUD removal and reinsertion and will  schedule a visit for replacement.  4) Routine healthcare maintenance including cholesterol, diabetes screening discussed: managed by PCP.  5) Anxiety/depression: stable on Lexapro and does not feel she needs further interventions at this time.  6) Follow up 1 year for routine annual exam.  Avel Sensor, CNM 04/16/2017  3:33 PM

## 2017-04-16 NOTE — Telephone Encounter (Signed)
11/12 at 10:30 for mirena removal and insert with JEG

## 2017-04-18 LAB — PAP IG W/ RFLX HPV ASCU: PAP SMEAR COMMENT: 0

## 2017-04-22 NOTE — Telephone Encounter (Signed)
Noted. Will order by apt date/time.

## 2017-05-05 ENCOUNTER — Ambulatory Visit (INDEPENDENT_AMBULATORY_CARE_PROVIDER_SITE_OTHER): Payer: BLUE CROSS/BLUE SHIELD | Admitting: Advanced Practice Midwife

## 2017-05-05 ENCOUNTER — Encounter: Payer: Self-pay | Admitting: Advanced Practice Midwife

## 2017-05-05 VITALS — BP 114/74 | Ht 64.0 in | Wt 154.0 lb

## 2017-05-05 DIAGNOSIS — Z30433 Encounter for removal and reinsertion of intrauterine contraceptive device: Secondary | ICD-10-CM

## 2017-05-05 NOTE — Progress Notes (Signed)
    GYNECOLOGY OFFICE PROCEDURE NOTE  Melissa Terrell is a 31 y.o. G1P0101 here for IUD removal and reinsertion. The patient currently has a Mirena IUD placed in 2013, which will be replaced with a Mirena IUD today.  No GYN concerns.  Last pap smear was on 04/16/2017 and was normal.  IUD Removal and Reinsertion  Patient identified, informed consent performed, consent signed.   Discussed risks of irregular bleeding, cramping, infection, malpositioning or uterine perforation of the IUD which may require further procedures. Time out was performed. Speculum placed in the vagina. The strings of the IUD were grasped and pulled using ring forceps. The IUD was successfully removed in its entirety. The cervix was cleaned with Betadine x 2 and grasped anteriorly with a single tooth tenaculum.  A uterine sound was used to sound the uterus to 6 cm.  The IUD was then placed per manufacturer's recommendations. Strings trimmed to 3 cm. Tenaculum was removed, good hemostasis noted. Patient tolerated procedure well.   Patient was given post-procedure instructions.  Patient was also asked to check IUD strings periodically and follow up in 4 weeks for IUD check.   Melissa Terrell, CNM  [facility], [facility]  IUD insertion CPT [phone],  Christean GriefSkyla [id number] Mirena [phone] Penni BombardLiletta [phone] Paraguard [id number] IUD remval [id number] Modifer 25, plus Modifer 79 is done during a global billing visit

## 2017-05-07 NOTE — Telephone Encounter (Signed)
Mirena stock was provided for this patient. 

## 2017-06-20 ENCOUNTER — Ambulatory Visit: Payer: BLUE CROSS/BLUE SHIELD | Admitting: Obstetrics and Gynecology

## 2018-07-21 ENCOUNTER — Ambulatory Visit
Admission: EM | Admit: 2018-07-21 | Discharge: 2018-07-21 | Disposition: A | Discharge status: Home / Self Care | Payer: Medicaid Other | Attending: Family Medicine | Admitting: Family Medicine

## 2018-07-21 ENCOUNTER — Other Ambulatory Visit: Payer: Self-pay

## 2018-07-21 DIAGNOSIS — J069 Acute upper respiratory infection, unspecified: Secondary | ICD-10-CM | POA: Diagnosis not present

## 2018-07-21 DIAGNOSIS — Z720 Tobacco use: Secondary | ICD-10-CM | POA: Diagnosis not present

## 2018-07-21 MED ORDER — BENZONATATE 200 MG PO CAPS: ORAL_CAPSULE | ORAL | 0 refills | Status: DC

## 2018-07-21 MED ORDER — ALBUTEROL SULFATE HFA 108 (90 BASE) MCG/ACT IN AERS: 1.0000 | INHALATION_SPRAY | Freq: Four times a day (QID) | RESPIRATORY_TRACT | 0 refills | Status: DC | PRN

## 2018-07-21 MED ORDER — GUAIFENESIN-CODEINE 100-10 MG/5ML PO SYRP: 5.0000 mL | ORAL_SOLUTION | Freq: Three times a day (TID) | ORAL | 0 refills | Status: DC | PRN

## 2018-07-21 MED ORDER — CETIRIZINE-PSEUDOEPHEDRINE ER 5-120 MG PO TB12: 1.0000 | ORAL_TABLET | Freq: Every day | ORAL | 0 refills | Status: DC

## 2018-07-21 NOTE — ED Triage Notes (Signed)
Patient complains of cough, chest congestion x 1 week.

## 2018-07-21 NOTE — ED Provider Notes (Signed)
[facility]    CSN: [id number] Arrival date & time: 07/21/18  1825     History   Chief Complaint Chief Complaint  Patient presents with  . Cough    HPI [place] is a 33 y.o. female.   HPI  33 year old female presents with cough and chest congestion that she has had for over a week.  She has not had fever or chills.  She has experienced shortness of breath.  Pain has been severe enough to cause her to almost gag and vomit.  States that the household has had flu and other symptoms very similar to what she has now.  She is a current smoker.  She also has a history of diabetes.        Past Medical History:  Diagnosis Date  . Diabetes mellitus without complication ([place])   . Encounter for insertion of mirena IUD 2013  . Thyroid disease     Patient Active Problem List   Diagnosis Date Noted  . Encounter for annual routine gynecological examination 04/16/2017  . Attention deficit hyperactivity disorder (ADHD) 09/19/2011  . Diabetes mellitus ([place]) 09/16/2011  . Hypothyroidism 09/16/2011    Past Surgical History:  Procedure Laterality Date  . NO PAST SURGERIES      OB History    Gravida  1   Para  1   Term      Preterm  1   AB      Living  1     SAB      TAB      Ectopic      Multiple      Live Births  1            Home Medications    Prior to Admission medications   Medication Sig Start Date End Date Taking? Authorizing Provider  Blood Glucose Monitoring Suppl (FIFTY50 GLUCOSE METER 2.0) w/Device KIT Use as instructed diabetes 250.00 04/05/14  Yes [provider]  insulin aspart (NOVOLOG) 100 UNIT/ML FlexPen Inject into the skin. 03/31/17 07/21/18 Yes [provider]  Insulin Glargine (LANTUS SOLOSTAR) 100 UNIT/ML Solostar Pen Inject into the skin. 03/31/17 07/21/18 Yes [provider]  levonorgestrel (MIRENA) 20 MCG/24HR IUD Frequency:UNKNOWN   Dosage:0.0     Instructions:  Note:Dose: 1 10/15/11   Yes [provider]  levothyroxine (SYNTHROID, LEVOTHROID) 50 MCG tablet Take 50 mcg by mouth daily before breakfast.   Yes [provider]  metFORMIN (GLUCOPHAGE) 500 MG tablet Take 500 mg by mouth. 03/31/17 07/21/18 Yes [provider]  albuterol (PROVENTIL HFA;VENTOLIN HFA) 108 (90 Base) MCG/ACT inhaler Inhale 1-2 puffs into the lungs every 6 (six) hours as needed for wheezing or shortness of breath. Use with spacer 07/21/18   Lorin Picket, PA-C  benzonatate (TESSALON) 200 MG capsule Take one cap TID PRN cough 07/21/18   Crecencio Mc P, PA-C  cetirizine-pseudoephedrine (ZYRTEC-D) 5-120 MG tablet Take 1 tablet by mouth daily. 07/21/18   Lorin Picket, PA-C  guaiFENesin-codeine (CHERATUSSIN AC) 100-10 MG/5ML syrup Take 5 mLs by mouth 3 (three) times daily as needed for cough. 07/21/18   Lorin Picket, PA-C  Insulin Pen Needle (UNIFINE PENTIPS) 31G X 8 MM MISC Use for insulin pen 4 times daily E11.65 07/19/14   [provider]    Family History Family History  Problem Relation Age of Onset  . Ovarian cancer Other   . Diabetes Mother   . Heart disease Father  Social History Social History   Tobacco Use  . Smoking status: Current Every Day Smoker    Packs/day: 0.50    Types: Cigarettes  . Smokeless tobacco: Never Used  Substance Use Topics  . Alcohol use: No  . Drug use: No     Allergies   Penicillins   Review of Systems Review of Systems  Constitutional: Positive for activity change, chills and fatigue. Negative for appetite change and fever.  HENT: Positive for congestion, postnasal drip and rhinorrhea.   Respiratory: Positive for cough and shortness of breath.   All other systems reviewed and are negative.    Physical Exam Triage Vital Signs ED Triage Vitals  Enc Vitals Group     BP 07/21/18 1856 115/80     Pulse Rate 07/21/18 1856 75     Resp 07/21/18 1856 16     Temp 07/21/18 1856 98.7 F (37.1 C)     Temp Source  07/21/18 1856 Oral     SpO2 07/21/18 1856 100 %     Weight 07/21/18 1853 152 lb (68.9 kg)     Height 07/21/18 1853 '5\' 4"'  (1.626 m)     Head Circumference --      Peak Flow --      Pain Score 07/21/18 1853 0     Pain Loc --      Pain Edu? --      Excl. in [place]? --    No data found.  Updated Vital Signs BP 115/80 (BP Location: Left Arm)   Pulse 75   Temp 98.7 F (37.1 C) (Oral)   Resp 16   Ht '5\' 4"'  (1.626 m)   Wt 152 lb (68.9 kg)   SpO2 100%   BMI 26.09 kg/m   Visual Acuity Right Eye Distance:   Left Eye Distance:   Bilateral Distance:    Right Eye Near:   Left Eye Near:    Bilateral Near:     Physical Exam Vitals signs and nursing note reviewed.  Constitutional:      General: She is not in acute distress.    Appearance: Normal appearance. She is not ill-appearing, toxic-appearing or diaphoretic.  HENT:     Head: Normocephalic.     Right Ear: Tympanic membrane, ear canal and external ear normal.     Left Ear: Tympanic membrane, ear canal and external ear normal.     Nose: Congestion and rhinorrhea present.     Mouth/Throat:     Mouth: Mucous membranes are moist.     Pharynx: Oropharynx is clear. No oropharyngeal exudate or posterior oropharyngeal erythema.  Eyes:     General:        Right eye: No discharge.        Left eye: No discharge.     Conjunctiva/sclera: Conjunctivae normal.  Neck:     Musculoskeletal: Normal range of motion and neck supple.  Pulmonary:     Effort: Pulmonary effort is normal.     Breath sounds: Normal breath sounds.  Musculoskeletal: Normal range of motion.  Lymphadenopathy:     Cervical: No cervical adenopathy.  Skin:    General: Skin is warm and dry.  Neurological:     General: No focal deficit present.     Mental Status: She is alert and oriented to person, place, and time.  Psychiatric:        Mood and Affect: Mood normal.        Behavior: Behavior normal.  Thought Content: Thought content normal.        Judgment:  Judgment normal.      UC Treatments / Results  Labs (all labs ordered are listed, but only abnormal results are displayed) Labs Reviewed - No data to display  EKG None  Radiology No results found.  Procedures Procedures (including critical care time)  Medications Ordered in UC Medications - No data to display  Initial Impression / Assessment and Plan / UC Course  I have reviewed the triage vital signs and the nursing notes.  Pertinent labs & imaging results that were available during my care of the patient were reviewed by me and considered in my medical decision making (see chart for details).   Has an upper respiratory infection.  It viral does not require antibiotics.  Provide her with cough suppressant with Tessalon Perles and Cheratussin Cough Syrup for Nighttime Use.  Provide Her with Albuterol Shortness of Breath and Take the for Her Nasal Congestion.  She Does Not like Using Nasal Sprays.  Not Improving She Should Follow-Up with Her Primary Care Physician   Final Clinical Impressions(s) / UC Diagnoses   Final diagnoses:  Upper respiratory tract infection, unspecified type   Discharge Instructions   None    ED Prescriptions    Medication Sig Dispense Auth. Provider   albuterol (PROVENTIL HFA;VENTOLIN HFA) 108 (90 Base) MCG/ACT inhaler Inhale 1-2 puffs into the lungs every 6 (six) hours as needed for wheezing or shortness of breath. Use with spacer 1 Inhaler Lorin Picket, PA-C   guaiFENesin-codeine (CHERATUSSIN AC) 100-10 MG/5ML syrup Take 5 mLs by mouth 3 (three) times daily as needed for cough. 120 mL Crecencio Mc P, PA-C   benzonatate (TESSALON) 200 MG capsule Take one cap TID PRN cough 30 capsule Crecencio Mc P, PA-C   cetirizine-pseudoephedrine (ZYRTEC-D) 5-120 MG tablet Take 1 tablet by mouth daily. 30 tablet Lorin Picket, PA-C     Controlled Substance Prescriptions [place] Controlled Substance Registry consulted? Not Applicable   Lorin Picket, PA-C 07/21/18 2144

## 2018-08-21 DIAGNOSIS — F419 Anxiety disorder, unspecified: Secondary | ICD-10-CM | POA: Insufficient documentation

## 2018-08-21 DIAGNOSIS — L409 Psoriasis, unspecified: Secondary | ICD-10-CM | POA: Insufficient documentation

## 2018-10-26 ENCOUNTER — Ambulatory Visit: Payer: Managed Care, Other (non HMO) | Admitting: Dietician

## 2018-12-07 ENCOUNTER — Ambulatory Visit: Payer: Managed Care, Other (non HMO) | Admitting: Dietician

## 2018-12-14 ENCOUNTER — Ambulatory Visit: Payer: Managed Care, Other (non HMO) | Admitting: Dietician

## 2019-04-28 ENCOUNTER — Other Ambulatory Visit: Payer: Self-pay

## 2019-04-28 DIAGNOSIS — Z20822 Contact with and (suspected) exposure to covid-19: Secondary | ICD-10-CM

## 2019-04-29 LAB — NOVEL CORONAVIRUS, NAA: SARS-CoV-2, NAA: NOT DETECTED

## 2020-03-08 ENCOUNTER — Other Ambulatory Visit: Payer: Self-pay

## 2020-03-08 ENCOUNTER — Other Ambulatory Visit: Payer: Managed Care, Other (non HMO)

## 2020-03-08 DIAGNOSIS — Z20822 Contact with and (suspected) exposure to covid-19: Secondary | ICD-10-CM

## 2020-03-10 LAB — SARS-COV-2, NAA 2 DAY TAT

## 2020-03-10 LAB — NOVEL CORONAVIRUS, NAA: SARS-CoV-2, NAA: NOT DETECTED

## 2020-04-05 ENCOUNTER — Ambulatory Visit (INDEPENDENT_AMBULATORY_CARE_PROVIDER_SITE_OTHER): Payer: Medicaid Other

## 2020-04-05 ENCOUNTER — Other Ambulatory Visit: Payer: Self-pay

## 2020-04-05 ENCOUNTER — Ambulatory Visit
Admission: RE | Admit: 2020-04-05 | Discharge: 2020-04-05 | Disposition: A | Discharge status: Home / Self Care | Payer: Medicaid Other | Source: Ambulatory Visit | Attending: Emergency Medicine | Admitting: Emergency Medicine

## 2020-04-05 VITALS — BP 113/77 | HR 77 | Temp 98.1°F | Resp 16 | Ht 64.0 in | Wt 180.0 lb

## 2020-04-05 DIAGNOSIS — H9201 Otalgia, right ear: Secondary | ICD-10-CM | POA: Diagnosis not present

## 2020-04-05 DIAGNOSIS — M792 Neuralgia and neuritis, unspecified: Secondary | ICD-10-CM | POA: Diagnosis not present

## 2020-04-05 DIAGNOSIS — G500 Trigeminal neuralgia: Secondary | ICD-10-CM | POA: Diagnosis not present

## 2020-04-05 DIAGNOSIS — M26621 Arthralgia of right temporomandibular joint: Secondary | ICD-10-CM | POA: Insufficient documentation

## 2020-04-05 DIAGNOSIS — H6690 Otitis media, unspecified, unspecified ear: Secondary | ICD-10-CM | POA: Diagnosis not present

## 2020-04-05 LAB — COMPREHENSIVE METABOLIC PANEL
ALT: 10 U/L (ref 0–44)
AST: 14 U/L — ABNORMAL LOW (ref 15–41)
Albumin: 4.1 g/dL (ref 3.5–5.0)
Alkaline Phosphatase: 62 U/L (ref 38–126)
Anion gap: 8 (ref 5–15)
BUN: 12 mg/dL (ref 6–20)
CO2: 22 mmol/L (ref 22–32)
Calcium: 8.8 mg/dL — ABNORMAL LOW (ref 8.9–10.3)
Chloride: 101 mmol/L (ref 98–111)
Creatinine, Ser: 0.72 mg/dL (ref 0.44–1.00)
GFR, Estimated: >60 mL/min (ref >60)
Glucose, Bld: 304 mg/dL — ABNORMAL HIGH (ref 70–99)
Potassium: 4.3 mmol/L (ref 3.5–5.1)
Sodium: 131 mmol/L — ABNORMAL LOW (ref 135–145)
Total Bilirubin: 0.4 mg/dL (ref 0.3–1.2)
Total Protein: 7.2 g/dL (ref 6.5–8.1)

## 2020-04-05 LAB — CBC WITH DIFFERENTIAL/PLATELET
Abs Immature Granulocytes: 0.01 10*3/uL (ref 0.00–0.07)
Basophils Absolute: 0 10*3/uL (ref 0.0–0.1)
Basophils Relative: 1 %
Eosinophils Absolute: 0.2 10*3/uL (ref 0.0–0.5)
Eosinophils Relative: 2 %
HCT: 40.8 % (ref 36.0–46.0)
Hemoglobin: 14 g/dL (ref 12.0–15.0)
Immature Granulocytes: 0 %
Lymphocytes Relative: 27 %
Lymphs Abs: 2.4 10*3/uL (ref 0.7–4.0)
MCH: 31.2 pg (ref 26.0–34.0)
MCHC: 34.3 g/dL (ref 30.0–36.0)
MCV: 90.9 fL (ref 80.0–100.0)
Monocytes Absolute: 0.6 10*3/uL (ref 0.1–1.0)
Monocytes Relative: 7 %
Neutro Abs: 5.5 10*3/uL (ref 1.7–7.7)
Neutrophils Relative %: 63 %
Platelets: 394 10*3/uL (ref 150–400)
RBC: 4.49 MIL/uL (ref 3.87–5.11)
RDW: 12.2 % (ref 11.5–15.5)
WBC: 8.7 10*3/uL (ref 4.0–10.5)
nRBC: 0 % (ref 0.0–0.2)

## 2020-04-05 LAB — C-REACTIVE PROTEIN: CRP: 0.6 mg/dL (ref <1.0)

## 2020-04-05 IMAGING — CT CT MAXILLOFACIAL W/ CM
1 series · 15 of 30 positions shown, 19 images · IV contrast (omnipaque)
Comparison: None.

CLINICAL DATA: Otitis externa 1 month ago after swimming.
Persistent pain on the right. Now with abnormal facial nerve
sensations on the right.

EXAM:
CT MAXILLOFACIAL WITH CONTRAST
TECHNIQUE: Multidetector CT imaging of the maxillofacial structures was
performed with intravenous contrast. Multiplanar CT image
reconstructions were also generated.
CONTRAST:  75mL OMNIPAQUE IOHEXOL 300 MG/ML  SOLN

[Series 2: max soft · axial · 0.39mm/px · z∈[-334,-152]mm · 15 of 99 slices shown, 19 images]
[im 4/99  brain]
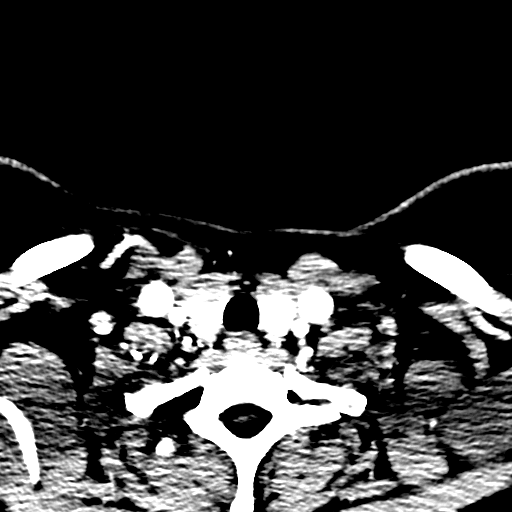
[im 4/99  bone]
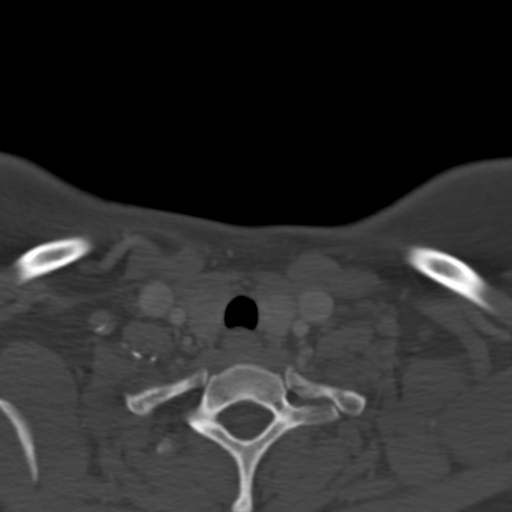
[im 11/99  bone]
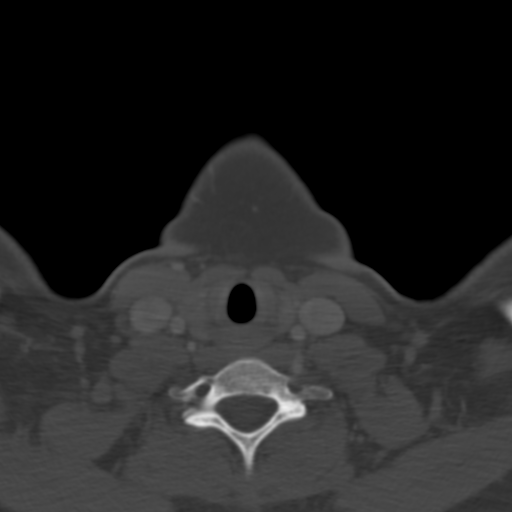
[im 17/99  bone]
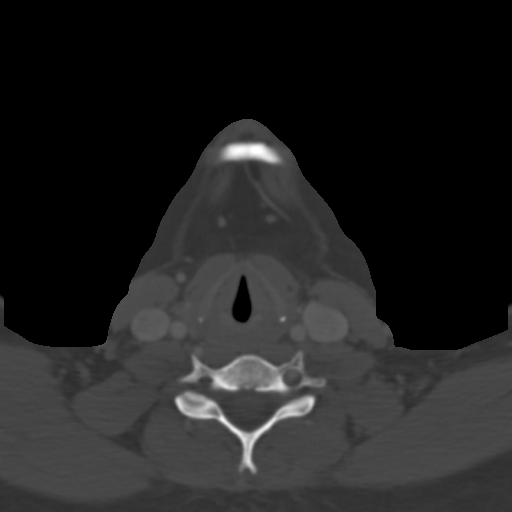
[im 24/99  bone]
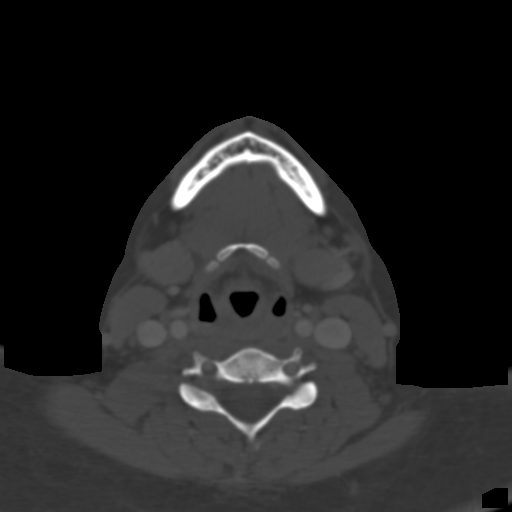
[im 31/99  brain]
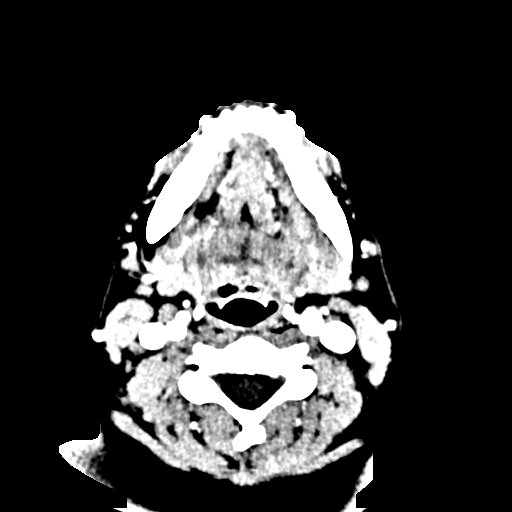
[im 31/99  bone]
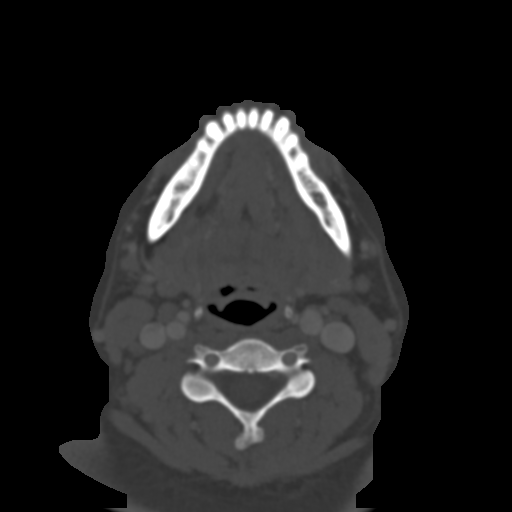
[im 38/99  bone]
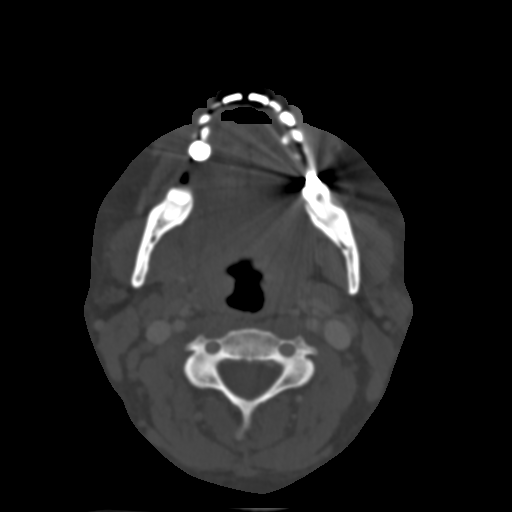
[im 44/99  bone]
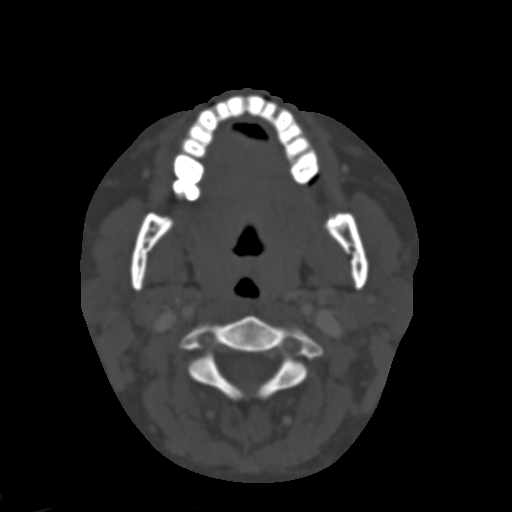
[im 51/99  bone]
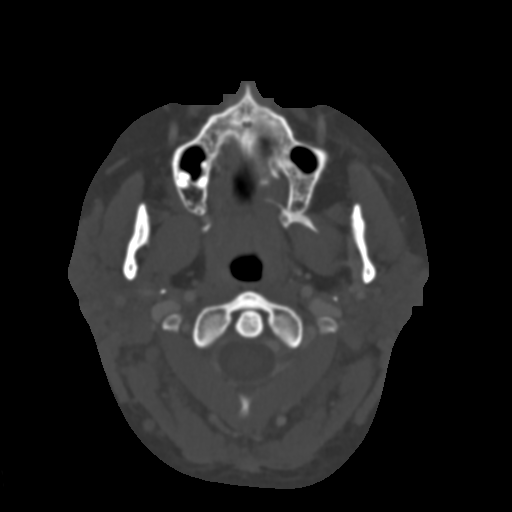
[im 55/99  brain]
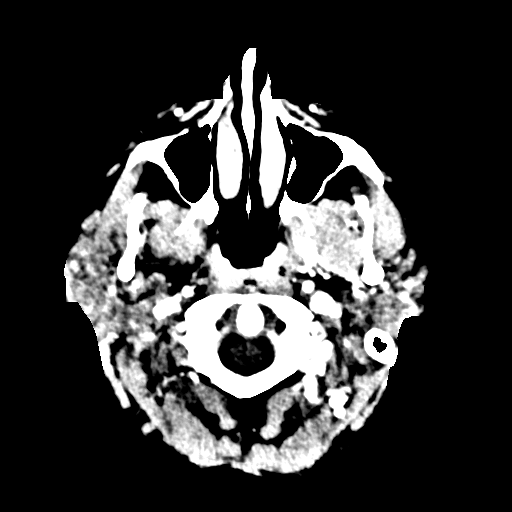
[im 55/99  bone]
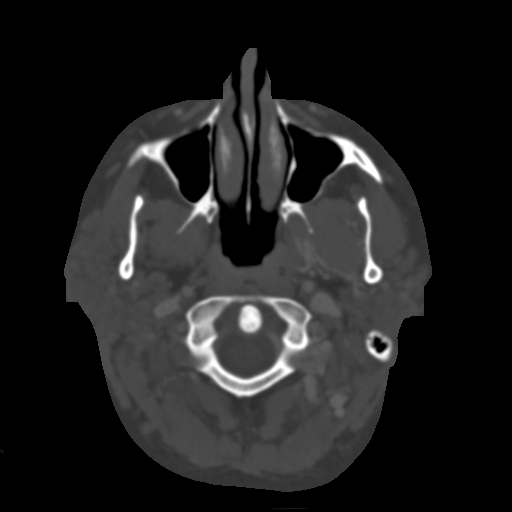
[im 61/99  bone]
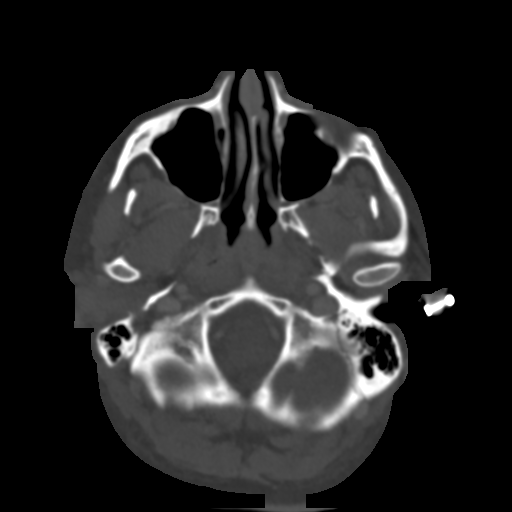
[im 68/99  bone]
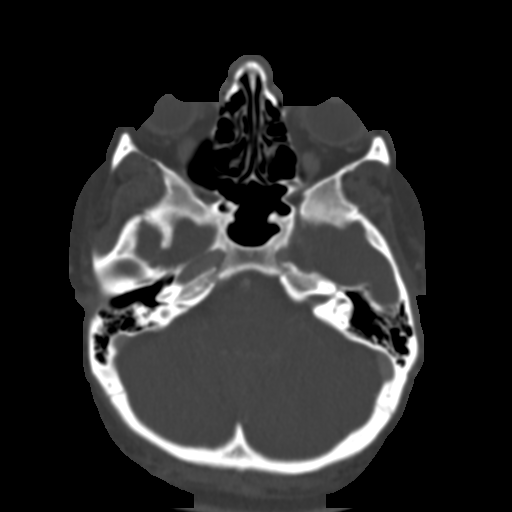
[im 75/99  bone]
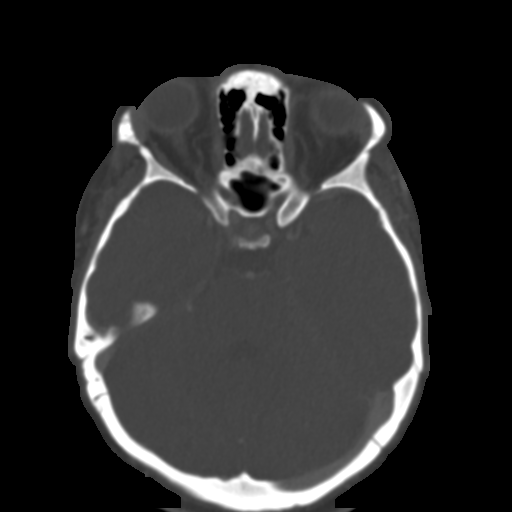
[im 82/99  brain]
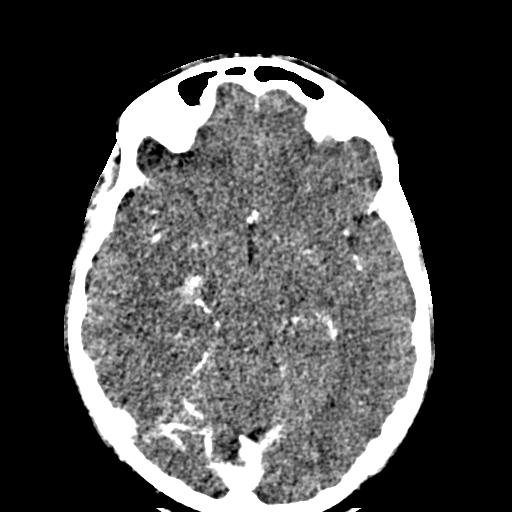
[im 82/99  bone]
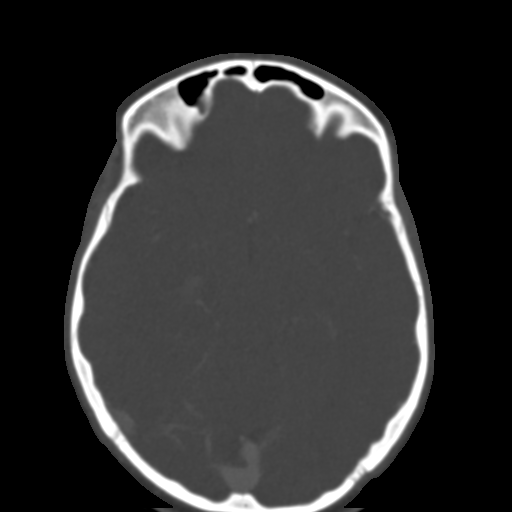
[im 88/99  bone]
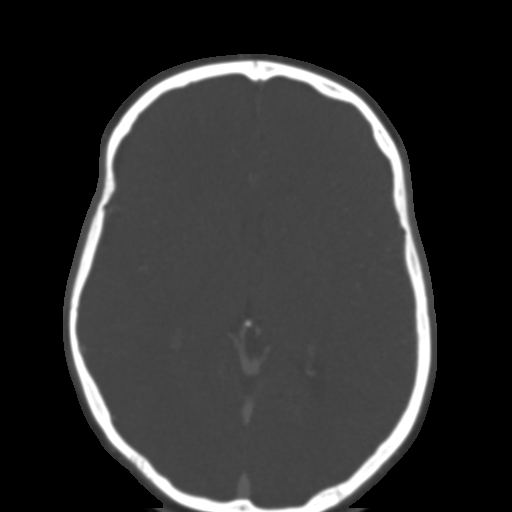
[im 95/99  bone]
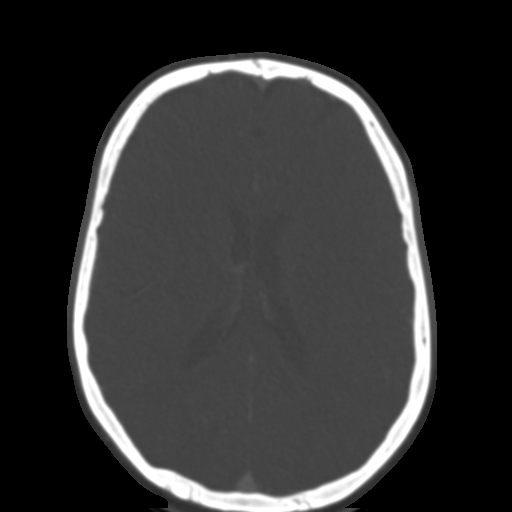

[15 of 30 positions shown; findings below may reference images not displayed]

FINDINGS: Osseous: No regional osseous abnormality.

Orbits: Normal. Globes, optic nerves, orbital fat and extraocular
muscles all appear normal.

Sinuses: Paranasal sinuses are clear. No fluid in the middle ears or
mastoids.

Soft tissues and temporal bones: Both external auditory canals
appear symmetric and within normal limits. Piercing on the left. No
fluid in either middle ear. No mastoid fluid. No attic fluid. Inner
ear structures appear normal.

Limited intracranial: Normal
IMPRESSION: Normal examination. No evidence of inflammatory disease. No
abnormality seen to explain the presenting symptoms.

These results will be called to the ordering clinician or
representative by the Radiologist Assistant, and communication
documented in the PACS or [REDACTED].

## 2020-04-05 MED ORDER — GABAPENTIN 300 MG PO CAPS: 300.0000 mg | ORAL_CAPSULE | Freq: Three times a day (TID) | ORAL | 0 refills | Status: AC

## 2020-04-05 MED ORDER — METHYLPREDNISOLONE 4 MG PO TBPK: ORAL_TABLET | ORAL | 0 refills | Status: DC

## 2020-04-05 MED ORDER — DOXYCYCLINE HYCLATE 100 MG PO CAPS: 100.0000 mg | ORAL_CAPSULE | Freq: Two times a day (BID) | ORAL | 0 refills | Status: DC

## 2020-04-05 MED ORDER — IOHEXOL 300 MG/ML  SOLN
75.0000 mL | Freq: Once | INTRAMUSCULAR | Status: AC | PRN
Start: 2020-04-05 — End: 2020-04-05
  Administered 2020-04-05: 75 mL via INTRAVENOUS

## 2020-04-05 MED ORDER — FLUCONAZOLE 200 MG PO TABS: 200.0000 mg | ORAL_TABLET | Freq: Every day | ORAL | 0 refills | Status: AC

## 2020-04-05 NOTE — ED Triage Notes (Signed)
Patient states that was seen by PCP 1 month and 2 days and diagnosed with swimmers ear. States that she was on drops. Patient states that now she is having pain on the side of her face and behind her ear. States that PCP also gave her oral antibiotic (zpack), but she has not started this. Patient states that she went to ED yesterday to try to get a CT scan for the infection. States that she would like this today to see where the infection has spread.

## 2020-04-05 NOTE — Discharge Instructions (Signed)
Take the Doxycycline twice daily for 10 days with food. Take the Medrol dose pack as directed by the package insert. You will need to keep a closer eye on your blood sugar and adjust insulin coverage. Take the Gabapentin 300 mg at bedtime for sleep and nerve pain.  Follow-up with your PCP.

## 2020-04-05 NOTE — ED Provider Notes (Signed)
[facility]    CSN: [id number] Arrival date & time: 04/05/20  1103      History   Chief Complaint Chief Complaint  Patient presents with  . Otalgia    right    HPI Melissa Terrell is a 34 y.o. female.   34 yo female here for evaluation of pain in her right ear, right temple, right side of face, down her right neck, and into the right occiput. She has had pain for 1 month since being treated for OE after swimming in a pool and the ocean at [place]. She was treated with drops at that time. She is now c/o pain when she chews on the right side under her cheek, a burning sensation to the inside of her cheek, pain on the outside of her right cheek, pain just in front of the auricle of her right ear, down her neck and across the back of her head. She has not had a fever or drainage from her ear, her hearing is muffled. She denies pain in her teeth but c/o pain in her face hen she first bites down on food.   She went to the ER last night but was not seen due to the long wait. Her PCP called in a Z-pack but she has not started that yet.      Past Medical History:  Diagnosis Date  . Diabetes mellitus without complication ([facility])   . Encounter for insertion of mirena IUD 2013  . Thyroid disease     Patient Active Problem List   Diagnosis Date Noted  . Encounter for annual routine gynecological examination 04/16/2017  . Attention deficit hyperactivity disorder (ADHD) 09/19/2011  . Diabetes mellitus ([facility]) 09/16/2011  . Hypothyroidism 09/16/2011    Past Surgical History:  Procedure Laterality Date  . NO PAST SURGERIES      OB History    Gravida  1   Para  1   Term      Preterm  1   AB      Living  1     SAB      TAB      Ectopic      Multiple      Live Births  1            Home Medications    Prior to Admission medications   Medication Sig Start Date End Date Taking? Authorizing Provider  Blood Glucose Monitoring Suppl (FIFTY50  GLUCOSE METER 2.0) w/Device KIT Use as instructed diabetes 250.00 04/05/14  Yes [provider]  insulin aspart (NOVOLOG) 100 UNIT/ML FlexPen Inject into the skin. 03/31/17 04/05/20 Yes [provider]  Insulin Glargine (LANTUS SOLOSTAR) 100 UNIT/ML Solostar Pen Inject into the skin. 03/31/17 04/05/20 Yes [provider]  Insulin Pen Needle (UNIFINE PENTIPS) 31G X 8 MM MISC Use for insulin pen 4 times daily E11.65 07/19/14  Yes [provider]  levonorgestrel (MIRENA) 20 MCG/24HR IUD Frequency:UNKNOWN   Dosage:0.0     Instructions:  Note:Dose: 1 10/15/11  Yes [provider]  levothyroxine (SYNTHROID, LEVOTHROID) 50 MCG tablet Take 50 mcg by mouth daily before breakfast.   Yes [provider]  metFORMIN (GLUCOPHAGE) 500 MG tablet Take 500 mg by mouth. 03/31/17 04/05/20 Yes [provider]  doxycycline (VIBRAMYCIN) 100 MG capsule Take 1 capsule (100 mg total) by mouth 2 (two) times daily. 04/05/20   Ryan, Jeremy, NP  gabapentin (NEURONTIN) 300 MG capsule Take 1 capsule (300 mg total)   by mouth 3 (three) times daily. 04/05/20   Margarette Canada, NP  methylPREDNISolone (MEDROL DOSEPAK) 4 MG TBPK tablet Take according to package insert 04/05/20   Margarette Canada, NP  albuterol (PROVENTIL HFA;VENTOLIN HFA) 108 (90 Base) MCG/ACT inhaler Inhale 1-2 puffs into the lungs every 6 (six) hours as needed for wheezing or shortness of breath. Use with spacer 07/21/18 04/05/20  Lorin Picket, PA-C    Family History Family History  Problem Relation Age of Onset  . Ovarian cancer Other   . Diabetes Mother   . Heart disease Father     Social History Social History   Tobacco Use  . Smoking status: Current Every Day Smoker    Packs/day: 0.50    Types: Cigarettes  . Smokeless tobacco: Never Used  Vaping Use  . Vaping Use: Never used  Substance Use Topics  . Alcohol use: No  . Drug use: No     Allergies   Penicillins   Review of Systems Review  of Systems  Constitutional: Positive for chills. Negative for activity change, appetite change, diaphoresis and fever.       Has felt cold  HENT: Positive for ear pain, facial swelling, hearing loss and sinus pain. Negative for congestion, mouth sores, postnasal drip, rhinorrhea, sinus pressure, sore throat and tinnitus.        Pain with chewing in her face but not teeth, decreased hearing on the right, pain when touching her right cheek, burning on the inside of right cheek.   Eyes: Negative for discharge, redness and visual disturbance.  Respiratory: Negative for cough and shortness of breath.   Cardiovascular: Negative for chest pain.  Gastrointestinal: Negative for nausea and vomiting.  Genitourinary: Negative for dysuria and frequency.  Musculoskeletal: Positive for arthralgias, joint swelling, myalgias and neck pain. Negative for neck stiffness.       Pain in the right side of the neck, pain in the right jaw.   Skin: Negative.   Neurological: Negative for dizziness, seizures, syncope, facial asymmetry, light-headedness, numbness and headaches.  Hematological: Positive for adenopathy. Does not bruise/bleed easily.       Swollen lymph nodes on the right side of her neck.   Psychiatric/Behavioral: Negative.      Physical Exam Triage Vital Signs ED Triage Vitals  Enc Vitals Group     BP 04/05/20 1117 113/77     Pulse Rate 04/05/20 1117 77     Resp 04/05/20 1117 16     Temp 04/05/20 1117 98.1 F (36.7 C)     Temp Source 04/05/20 1117 Oral     SpO2 04/05/20 1117 99 %     Weight 04/05/20 1114 180 lb (81.6 kg)     Height 04/05/20 1114 5' 4" (1.626 m)     Head Circumference --      Peak Flow --      Pain Score 04/05/20 1114 5     Pain Loc --      Pain Edu? --      Excl. in [place]? --    No data found.  Updated Vital Signs BP 113/77 (BP Location: Left Arm)   Pulse 77   Temp 98.1 F (36.7 C) (Oral)   Resp 16   Ht 5' 4" (1.626 m)   Wt 180 lb (81.6 kg)   SpO2 99%   BMI 30.90  kg/m   Visual Acuity Right Eye Distance:   Left Eye Distance:   Bilateral Distance:    Right Eye Near:  Left Eye Near:    Bilateral Near:     Physical Exam Vitals and nursing note reviewed.  Constitutional:      General: She is not in acute distress.    Appearance: Normal appearance. She is not ill-appearing or toxic-appearing.  HENT:     Head: Normocephalic and atraumatic.     Right Ear: Ear canal and external ear normal. There is no impacted cerumen.     Left Ear: Tympanic membrane, ear canal and external ear normal. There is no impacted cerumen.     Ears:     Comments: Right TM shows marked injection. Unable to visualize pus or effusion behind TM.    Nose: Nose normal. No congestion or rhinorrhea.     Mouth/Throat:     Mouth: Mucous membranes are moist.     Pharynx: Oropharynx is clear. No oropharyngeal exudate or posterior oropharyngeal erythema.     Comments: There is tenderness to palpation of the right TMJ. With movement of the mandible there is altered ROM. The jaw opens sooner on the left, swings right and then back into position. No crepitus with ROM. The right TMJ is swollen, tender to palpation but there is no overlaying erythema. NO facial asymmetry.  Eyes:     Extraocular Movements: Extraocular movements intact.     Conjunctiva/sclera: Conjunctivae normal.     Pupils: Pupils are equal, round, and reactive to light.  Neck:     Comments: Shotty anterior and posterior cervical lymphadenopathy on the right, mild shotty anterior lymphadenopathy on the left. Tender to palpation.  Cardiovascular:     Rate and Rhythm: Normal rate and regular rhythm.     Pulses: Normal pulses.     Heart sounds: Normal heart sounds. No murmur heard.  No gallop.   Pulmonary:     Effort: Pulmonary effort is normal. No respiratory distress.     Breath sounds: Normal breath sounds. No wheezing, rhonchi or rales.  Musculoskeletal:        General: No tenderness. Normal range of motion.      Cervical back: Normal range of motion.     Comments: See notation on R TMJ previous. All other joints have normal ROM.   Lymphadenopathy:     Cervical: Cervical adenopathy present.  Skin:    General: Skin is warm and dry.     Capillary Refill: Capillary refill takes less than 2 seconds.     Coloration: Skin is not jaundiced.     Findings: No erythema or rash.  Neurological:     General: No focal deficit present.     Mental Status: She is alert and oriented to person, place, and time.     Cranial Nerves: No cranial nerve deficit.     Sensory: No sensory deficit.     Comments: There is marked tenderness along the middle branch of the right facial nerve and tenderness to palpation. Possible that the tenderness is also the maxillary branch of the trigeminal nerve.   Psychiatric:        Mood and Affect: Mood normal.        Behavior: Behavior normal.        Thought Content: Thought content normal.        Judgment: Judgment normal.      UC Treatments / Results  Labs (all labs ordered are listed, but only abnormal results are displayed) Labs Reviewed  COMPREHENSIVE METABOLIC PANEL - Abnormal; Notable for the following components:      Result Value     Sodium 131 (*)    Glucose, Bld 304 (*)    Calcium 8.8 (*)    AST 14 (*)    All other components within normal limits  CBC WITH DIFFERENTIAL/PLATELET  C-REACTIVE PROTEIN    EKG   Radiology CT Maxillofacial W Contrast  Result Date: 04/05/2020 CLINICAL DATA:  Otitis externa 1 month ago after swimming. Persistent pain on the right. Now with abnormal facial nerve sensations on the right. EXAM: CT MAXILLOFACIAL WITH CONTRAST TECHNIQUE: Multidetector CT imaging of the maxillofacial structures was performed with intravenous contrast. Multiplanar CT image reconstructions were also generated. CONTRAST:  73m OMNIPAQUE IOHEXOL 300 MG/ML  SOLN COMPARISON:  None. FINDINGS: Osseous: No regional osseous abnormality. Orbits: Normal. Globes, optic  nerves, orbital fat and extraocular muscles all appear normal. Sinuses: Paranasal sinuses are clear. No fluid in the middle ears or mastoids. Soft tissues and temporal bones: Both external auditory canals appear symmetric and within normal limits. Piercing on the left. No fluid in either middle ear. No mastoid fluid. No attic fluid. Inner ear structures appear normal. Limited intracranial: Normal IMPRESSION: Normal examination. No evidence of inflammatory disease. No abnormality seen to explain the presenting symptoms. These results will be called to the ordering clinician or representative by the Radiologist Assistant, and communication documented in the PACS or [employer] Electronically Signed   By: MNelson ChimesM.D.   On: 04/05/2020 13:31    Procedures Procedures (including critical care time)  Medications Ordered in UC Medications  iohexol (OMNIPAQUE) 300 MG/ML solution 75 mL (75 mLs Intravenous Contrast Given 04/05/20 1311)    Initial Impression / Assessment and Plan / UC Course  I have reviewed the triage vital signs and the nursing notes.  Pertinent labs & imaging results that were available during my care of the patient were reviewed by me and considered in my medical decision making (see chart for details).   Patient is here for right ear, TMJ, and facial pain x 1 month. Of concern is that she has had continues pain since developing an otitis externa 1 month ago [facility] She doe snot look septic but there is marked injection of the right TM, the right TMJ has palpable inflammation, jaw articulation has been altered, she has nerve inflammation in her middle cheek on the right. Will obtain labs and a maxillofacial CT with contrast to look for infection or inflammatory changes of the bone.  CBC is unremarkable. The patients CMP shows elevated glucose of 304- she has DM and ate a cake pop PTA. She did cover herself with SSI but thinks she  did not give herself enough.   CT scan is negative. Will cover for OM with Doxycycline, give medrol for nerve pain and gabapentin for bedtime.    Final Clinical Impressions(s) / UC Diagnoses   Final diagnoses:  Acute otitis media, unspecified otitis media type  Arthralgia of right temporomandibular joint  Trigeminal neuralgia of right side of face     Discharge Instructions     Take the Doxycycline twice daily for 10 days with food. Take the Medrol dose pack as directed by the package insert. You will need to keep a closer eye on your blood sugar and adjust insulin coverage. Take the Gabapentin 300 mg at bedtime for sleep and nerve pain.  Follow-up with your PCP.     ED Prescriptions    Medication Sig Dispense Auth. Provider   doxycycline (VIBRAMYCIN) 100 MG capsule  (  Status: Discontinued) Take 1 capsule (100 mg total) by mouth 2 (two) times daily. 20 capsule Margarette Canada, NP   methylPREDNISolone (MEDROL DOSEPAK) 4 MG TBPK tablet Take according to package insert 1 each Margarette Canada, NP   gabapentin (NEURONTIN) 300 MG capsule Take 1 capsule (300 mg total) by mouth 3 (three) times daily. 30 capsule Margarette Canada, NP   doxycycline (VIBRAMYCIN) 100 MG capsule Take 1 capsule (100 mg total) by mouth 2 (two) times daily. 20 capsule Margarette Canada, NP     PDMP not reviewed this encounter.   Margarette Canada, NP 04/05/20 1347

## 2020-05-01 ENCOUNTER — Other Ambulatory Visit: Payer: Self-pay | Admitting: Physician Assistant

## 2020-05-01 DIAGNOSIS — H9209 Otalgia, unspecified ear: Secondary | ICD-10-CM

## 2020-05-01 DIAGNOSIS — G501 Atypical facial pain: Secondary | ICD-10-CM

## 2020-05-04 ENCOUNTER — Ambulatory Visit: Admission: RE | Admit: 2020-05-04 | Payer: Medicaid Other | Source: Ambulatory Visit

## 2020-05-26 ENCOUNTER — Ambulatory Visit: Admission: RE | Admit: 2020-05-26 | Payer: Medicaid Other | Source: Ambulatory Visit

## 2020-06-27 ENCOUNTER — Ambulatory Visit: Payer: Medicaid Other

## 2020-06-30 ENCOUNTER — Inpatient Hospital Stay: Admission: RE | Admit: 2020-06-30 | Payer: Medicaid Other | Source: Ambulatory Visit

## 2020-07-03 ENCOUNTER — Other Ambulatory Visit: Payer: Medicaid Other

## 2020-07-03 DIAGNOSIS — Z20822 Contact with and (suspected) exposure to covid-19: Secondary | ICD-10-CM

## 2020-07-04 LAB — NOVEL CORONAVIRUS, NAA: SARS-CoV-2, NAA: NOT DETECTED

## 2020-07-04 LAB — SARS-COV-2, NAA 2 DAY TAT

## 2020-07-04 LAB — SPECIMEN STATUS REPORT

## 2021-01-03 ENCOUNTER — Other Ambulatory Visit: Payer: Self-pay

## 2021-01-03 ENCOUNTER — Ambulatory Visit
Admission: RE | Admit: 2021-01-03 | Discharge: 2021-01-03 | Disposition: A | Discharge status: Home / Self Care | Payer: Medicaid Other | Source: Ambulatory Visit

## 2021-01-03 VITALS — BP 107/76 | HR 74 | Temp 98.8°F | Resp 18

## 2021-01-03 DIAGNOSIS — H6691 Otitis media, unspecified, right ear: Secondary | ICD-10-CM

## 2021-01-03 DIAGNOSIS — J0101 Acute recurrent maxillary sinusitis: Secondary | ICD-10-CM | POA: Diagnosis not present

## 2021-01-03 LAB — POCT URINE PREGNANCY: Preg Test, Ur: NEGATIVE

## 2021-01-03 MED ORDER — DOXYCYCLINE HYCLATE 100 MG PO CAPS: 100.0000 mg | ORAL_CAPSULE | Freq: Two times a day (BID) | ORAL | 0 refills | Status: DC

## 2021-01-03 NOTE — ED Triage Notes (Signed)
Patient c/o sinus pressure and RT sided ear fullness x 16 days.  Patient denies fever, diarrhea, and emesis.   Patient endorses onset of symptoms began with RT sided ear pain, sore throat, and nasal congestion. Patient endorses sore throat and ear pain has resolved.   Patient endorses head and back of neck pain. Patient endorses stiff neck.Patient endorses dizziness at times.   Patient endorses nausea at times.   Patient endorses changes to hearing in RT ear.   Patient took an at home COVID test with negative test result.   Patient endorses being prescribed azithromycin on July 7th approximately with some relief and then symptoms reoccurred.    Patient has used Sudafed and Dramamine with no relief of symptoms.

## 2021-01-03 NOTE — Discharge Instructions (Addendum)
Take the doxycycline as directed.  Schedule a follow-up appointment with your primary care provider soon as possible.  Go to the emergency department if you have acute worsening symptoms.

## 2021-01-03 NOTE — ED Provider Notes (Signed)
Roderic Palau    CSN: [id number] Arrival date & time: 01/03/21  3383      History   Chief Complaint Chief Complaint  Patient presents with   Facial Pain   Ear Fullness   APPT 0915    HPI Melissa Terrell is a 35 y.o. female.  Patient presents with 16 day history of right ear fullness and sinus pressure.  She also reports headache, stiff neck, occasional dizziness, and nausea.  She [place]  She also has been taking OTC sinus medication.  She denies fever, chills, cough, shortness of breath, or other symptoms.  At her request, her PCP made a referral to ENT on 12/29/2020.  Patient went to the ED for evaluation today but left before being seen due to the wait time. Her medical history includes diabetes, hypothyroidism, ADHD.  Patient has an IUD but states she has had weight gain and spotting; she would like a pregnancy test.  The history is provided by the patient and medical records.   Past Medical History:  Diagnosis Date   Diabetes mellitus without complication ([place])    Encounter for insertion of mirena IUD 2013   Thyroid disease     Patient Active Problem List   Diagnosis Date Noted   Encounter for annual routine gynecological examination 04/16/2017   Attention deficit hyperactivity disorder (ADHD) 09/19/2011   Diabetes mellitus ([place]) 09/16/2011   Hypothyroidism 09/16/2011    Past Surgical History:  Procedure Laterality Date   NO PAST SURGERIES      OB History     Gravida  1   Para  1   Term      Preterm  1   AB      Living  1      SAB      IAB      Ectopic      Multiple      Live Births  1            Home Medications    Prior to Admission medications   Medication Sig Start Date End Date Taking? Authorizing Provider  doxycycline (VIBRAMYCIN) 100 MG capsule Take 1 capsule (100 mg total) by mouth 2 (two) times daily. 01/03/21  Yes Sharion Balloon, NP  esomeprazole (NEXIUM)  20 MG capsule Take 20 mg by mouth daily. 12/11/20 12/11/21 Yes [provider]  levothyroxine (SYNTHROID, LEVOTHROID) 50 MCG tablet Take 50 mcg by mouth daily before breakfast.   Yes [provider]  metFORMIN (GLUCOPHAGE) 500 MG tablet Take 500 mg by mouth daily. 10/13/20  Yes [provider]  Blood Glucose Monitoring Suppl (FIFTY50 GLUCOSE METER 2.0) w/Device KIT Use as instructed diabetes 250.00 04/05/14   [provider]  gabapentin (NEURONTIN) 300 MG capsule Take 1 capsule (300 mg total) by mouth 3 (three) times daily. 04/05/20   Margarette Canada, NP  insulin aspart (NOVOLOG) 100 UNIT/ML FlexPen Inject into the skin. 03/31/17 04/05/20  [provider]  Insulin Glargine (LANTUS SOLOSTAR) 100 UNIT/ML Solostar Pen Inject into the skin. 03/31/17 04/05/20  [provider]  Insulin Pen Needle 31G X 8 MM MISC Use for insulin pen 4 times daily E11.65 07/19/14   [provider]  levonorgestrel (MIRENA) 20 MCG/24HR IUD Frequency:UNKNOWN   Dosage:0.0     Instructions:  Note:Dose: 1 10/15/11   [provider]  metFORMIN (GLUCOPHAGE) 500 MG tablet Take 500 mg by mouth. 03/31/17 04/05/20  [provider]  methylPREDNISolone (MEDROL DOSEPAK) 4 MG TBPK tablet Take according to package insert 04/05/20   Margarette Canada, NP  albuterol (PROVENTIL HFA;VENTOLIN HFA) 108 (90 Base) MCG/ACT inhaler Inhale 1-2 puffs into the lungs every 6 (six) hours as needed for wheezing or shortness of breath. Use with spacer 07/21/18 04/05/20  Lorin Picket, PA-C    Family History Family History  Problem Relation Age of Onset   Ovarian cancer Other    Diabetes Mother    Heart disease Father     Social History Social History   Tobacco Use   Smoking status: Former    Packs/day: 0.50    Pack years: 0.00    Types: Cigarettes   Smokeless tobacco: Never  Vaping Use   Vaping Use: Every day   Substances: Nicotine  Substance Use Topics   Alcohol use: No    Drug use: No     Allergies   Penicillins   Review of Systems Review of Systems  Constitutional:  Negative for chills and fever.  HENT:  Positive for congestion, ear pain and sinus pressure. Negative for ear discharge and sore throat.   Respiratory:  Negative for cough and shortness of breath.   Cardiovascular:  Negative for chest pain and palpitations.  Gastrointestinal:  Negative for abdominal pain, diarrhea and vomiting.  Skin:  Negative for color change and rash.  All other systems reviewed and are negative.   Physical Exam Triage Vital Signs ED Triage Vitals [01/03/21 0918]  Enc Vitals Group     BP 107/76     Pulse Rate 74     Resp 18     Temp 98.8 F (37.1 C)     Temp Source Oral     SpO2 97 %     Weight      Height      Head Circumference      Peak Flow      Pain Score 5     Pain Loc      Pain Edu?      Excl. in [place]?    No data found.  Updated Vital Signs BP 107/76 (BP Location: Left Arm)   Pulse 74   Temp 98.8 F (37.1 C) (Oral)   Resp 18   LMP  (LMP Unknown)   SpO2 97%   Visual Acuity Right Eye Distance:   Left Eye Distance:   Bilateral Distance:    Right Eye Near:   Left Eye Near:    Bilateral Near:     Physical Exam Vitals and nursing note reviewed.  Constitutional:      General: She is not in acute distress.    Appearance: She is well-developed. She is not ill-appearing.  HENT:     Head: Normocephalic and atraumatic.     Right Ear: Ear canal normal. Tympanic membrane is erythematous.     Left Ear: Tympanic membrane and ear canal normal.     Nose: Congestion present.     Mouth/Throat:     Mouth: Mucous membranes are moist.     Pharynx: Oropharynx is clear.  Eyes:     Conjunctiva/sclera: Conjunctivae normal.  Cardiovascular:     Rate and Rhythm: Normal rate and regular rhythm.     Heart sounds: Normal heart sounds.  Pulmonary:     Effort: Pulmonary effort is normal. No respiratory distress.     Breath sounds: Normal breath  sounds.  Abdominal:     Palpations: Abdomen is soft.  Tenderness: There is no abdominal tenderness.  Musculoskeletal:     Cervical back: Neck supple. No rigidity.  Skin:    General: Skin is warm and dry.  Neurological:     General: No focal deficit present.     Mental Status: She is alert and oriented to person, place, and time.     Gait: Gait normal.  Psychiatric:        Mood and Affect: Mood normal.        Behavior: Behavior normal.     UC Treatments / Results  Labs (all labs ordered are listed, but only abnormal results are displayed) Labs Reviewed  POCT URINE PREGNANCY    EKG   Radiology No results found.  Procedures Procedures (including critical care time)  Medications Ordered in UC Medications - No data to display  Initial Impression / Assessment and Plan / UC Course  I have reviewed the triage vital signs and the nursing notes.  Pertinent labs & imaging results that were available during my care of the patient were reviewed by me and considered in my medical decision making (see chart for details).  Right otitis media, acute sinusitis.  Treating with doxycycline.  Patient is well-appearing and her exam is reassuring.  She is afebrile and her vital signs are stable.  Instructed her to schedule a follow-up appointment with her PCP as soon as possible.  ED precautions discussed.  Patient agrees to plan of care.   Final Clinical Impressions(s) / UC Diagnoses   Final diagnoses:  Right otitis media, unspecified otitis media type  Acute recurrent maxillary sinusitis     Discharge Instructions      Take the doxycycline as directed.  Schedule a follow-up appointment with your primary care provider soon as possible.  Go to the emergency department if you have acute worsening symptoms.        ED Prescriptions     Medication Sig Dispense Auth. Provider   doxycycline (VIBRAMYCIN) 100 MG capsule Take 1 capsule (100 mg total) by mouth 2 (two) times daily.  20 capsule Sharion Balloon, NP      PDMP not reviewed this encounter.   Sharion Balloon, NP 01/03/21 1004

## 2021-01-12 ENCOUNTER — Encounter: Payer: Self-pay | Admitting: Emergency Medicine

## 2021-01-12 ENCOUNTER — Emergency Department: Payer: Medicaid Other

## 2021-01-12 ENCOUNTER — Other Ambulatory Visit: Payer: Self-pay

## 2021-01-12 DIAGNOSIS — R42 Dizziness and giddiness: Secondary | ICD-10-CM | POA: Diagnosis not present

## 2021-01-12 DIAGNOSIS — R110 Nausea: Secondary | ICD-10-CM | POA: Insufficient documentation

## 2021-01-12 DIAGNOSIS — M5412 Radiculopathy, cervical region: Secondary | ICD-10-CM | POA: Diagnosis not present

## 2021-01-12 DIAGNOSIS — E039 Hypothyroidism, unspecified: Secondary | ICD-10-CM | POA: Insufficient documentation

## 2021-01-12 DIAGNOSIS — E119 Type 2 diabetes mellitus without complications: Secondary | ICD-10-CM | POA: Diagnosis not present

## 2021-01-12 DIAGNOSIS — R519 Headache, unspecified: Secondary | ICD-10-CM | POA: Diagnosis not present

## 2021-01-12 DIAGNOSIS — R079 Chest pain, unspecified: Secondary | ICD-10-CM | POA: Diagnosis not present

## 2021-01-12 DIAGNOSIS — Z7984 Long term (current) use of oral hypoglycemic drugs: Secondary | ICD-10-CM | POA: Diagnosis not present

## 2021-01-12 DIAGNOSIS — Z79899 Other long term (current) drug therapy: Secondary | ICD-10-CM | POA: Insufficient documentation

## 2021-01-12 DIAGNOSIS — Z87891 Personal history of nicotine dependence: Secondary | ICD-10-CM | POA: Diagnosis not present

## 2021-01-12 LAB — CBC
HCT: 39.2 % (ref 36.0–46.0)
Hemoglobin: 13.3 g/dL (ref 12.0–15.0)
MCH: 30.9 pg (ref 26.0–34.0)
MCHC: 33.9 g/dL (ref 30.0–36.0)
MCV: 91 fL (ref 80.0–100.0)
Platelets: 491 10*3/uL — ABNORMAL HIGH (ref 150–400)
RBC: 4.31 MIL/uL (ref 3.87–5.11)
RDW: 12.5 % (ref 11.5–15.5)
WBC: 9.6 10*3/uL (ref 4.0–10.5)
nRBC: 0 % (ref 0.0–0.2)

## 2021-01-12 LAB — BASIC METABOLIC PANEL
Anion gap: 9 (ref 5–15)
BUN: 11 mg/dL (ref 6–20)
CO2: 27 mmol/L (ref 22–32)
Calcium: 9.3 mg/dL (ref 8.9–10.3)
Chloride: 100 mmol/L (ref 98–111)
Creatinine, Ser: 0.82 mg/dL (ref 0.44–1.00)
GFR, Estimated: >60 mL/min (ref >60)
Glucose, Bld: 263 mg/dL — ABNORMAL HIGH (ref 70–99)
Potassium: 3.8 mmol/L (ref 3.5–5.1)
Sodium: 136 mmol/L (ref 135–145)

## 2021-01-12 LAB — TROPONIN I (HIGH SENSITIVITY): Troponin I (High Sensitivity): <2 ng/L (ref <18)

## 2021-01-12 LAB — CBG MONITORING, ED: Glucose-Capillary: 251 mg/dL — ABNORMAL HIGH (ref 70–99)

## 2021-01-12 IMAGING — CR DG CHEST 2V
1 series · 2 of 2 positions shown · non-contrast
Comparison: Chest radiograph dated [DATE].

CLINICAL DATA: 34-year-old female with chest pain.

EXAM:
CHEST - 2 VIEW

[Series 1: dg chest 2 view · 0.14mm/px · 2 of 2 slices shown]
[im 1/2]
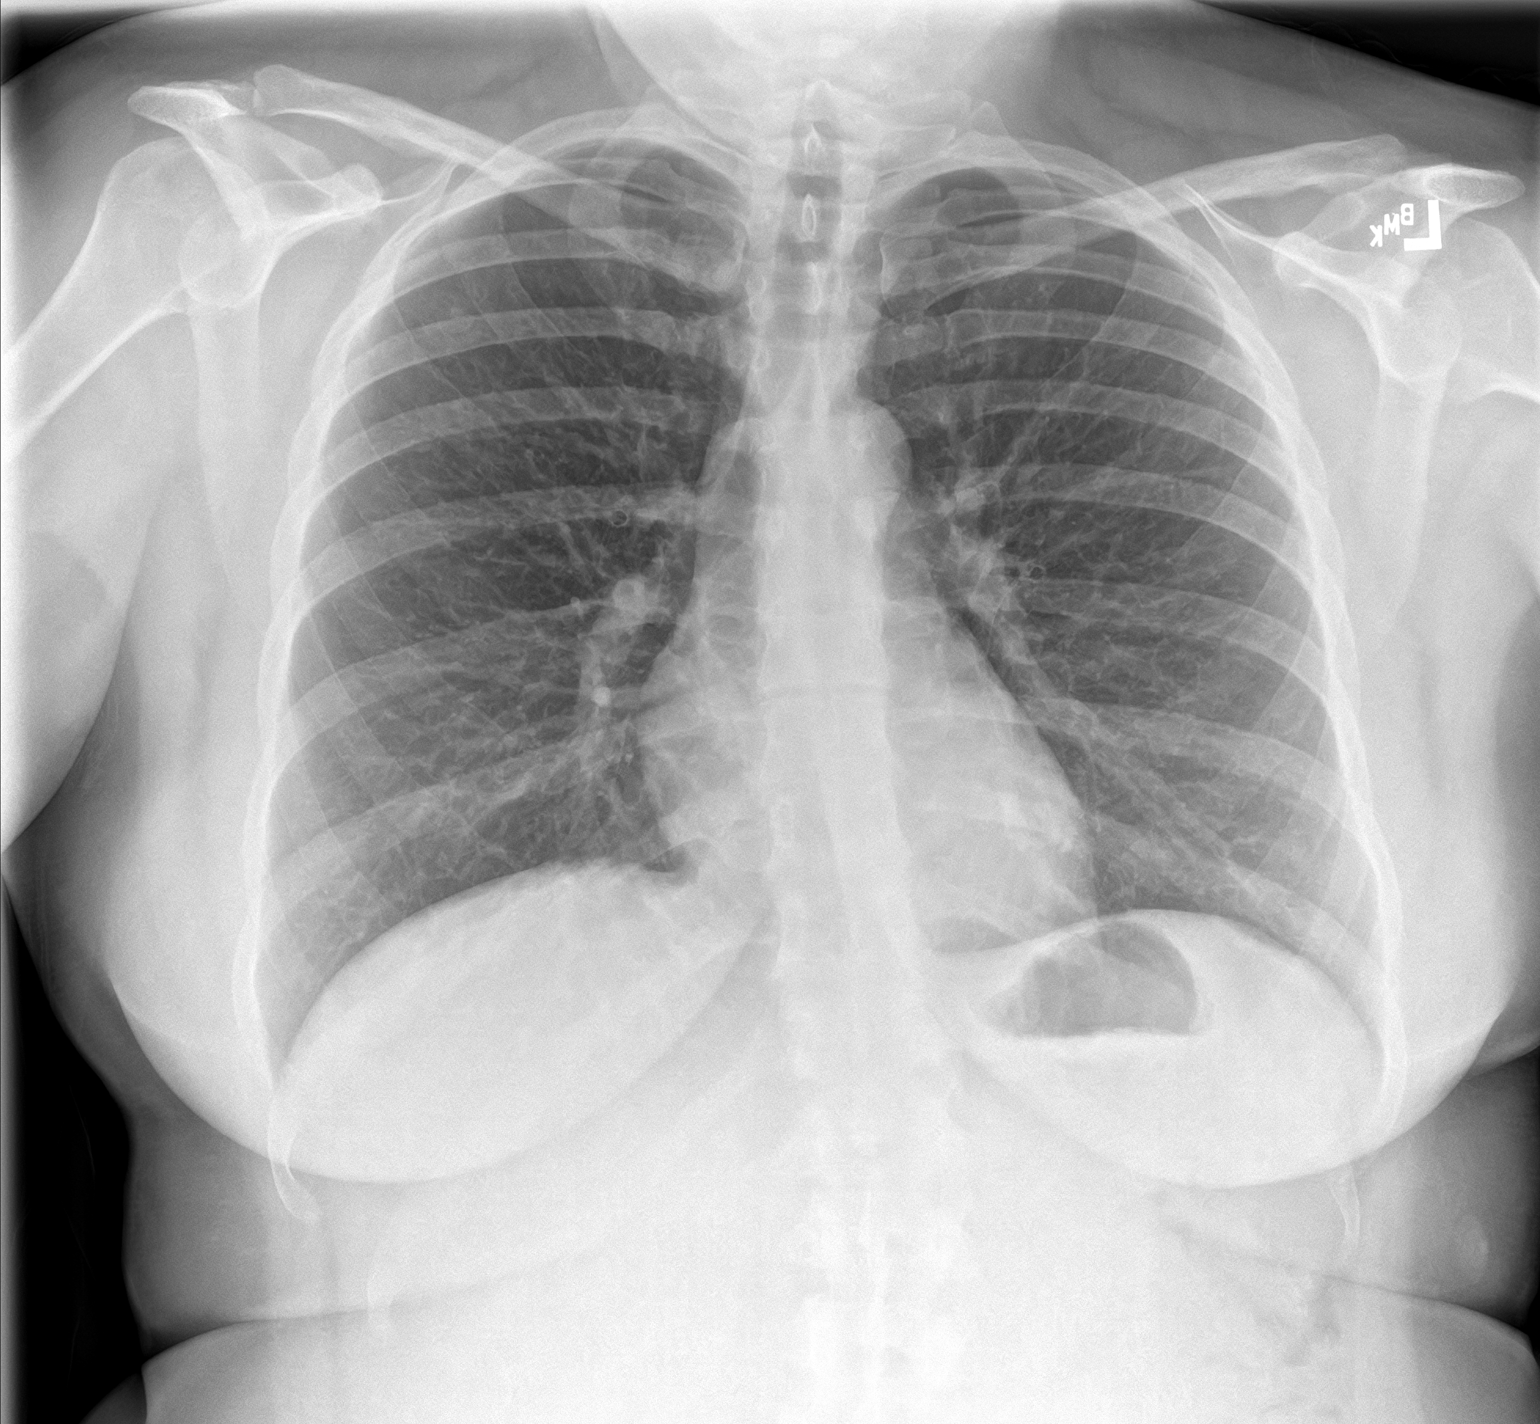
[im 2/2]
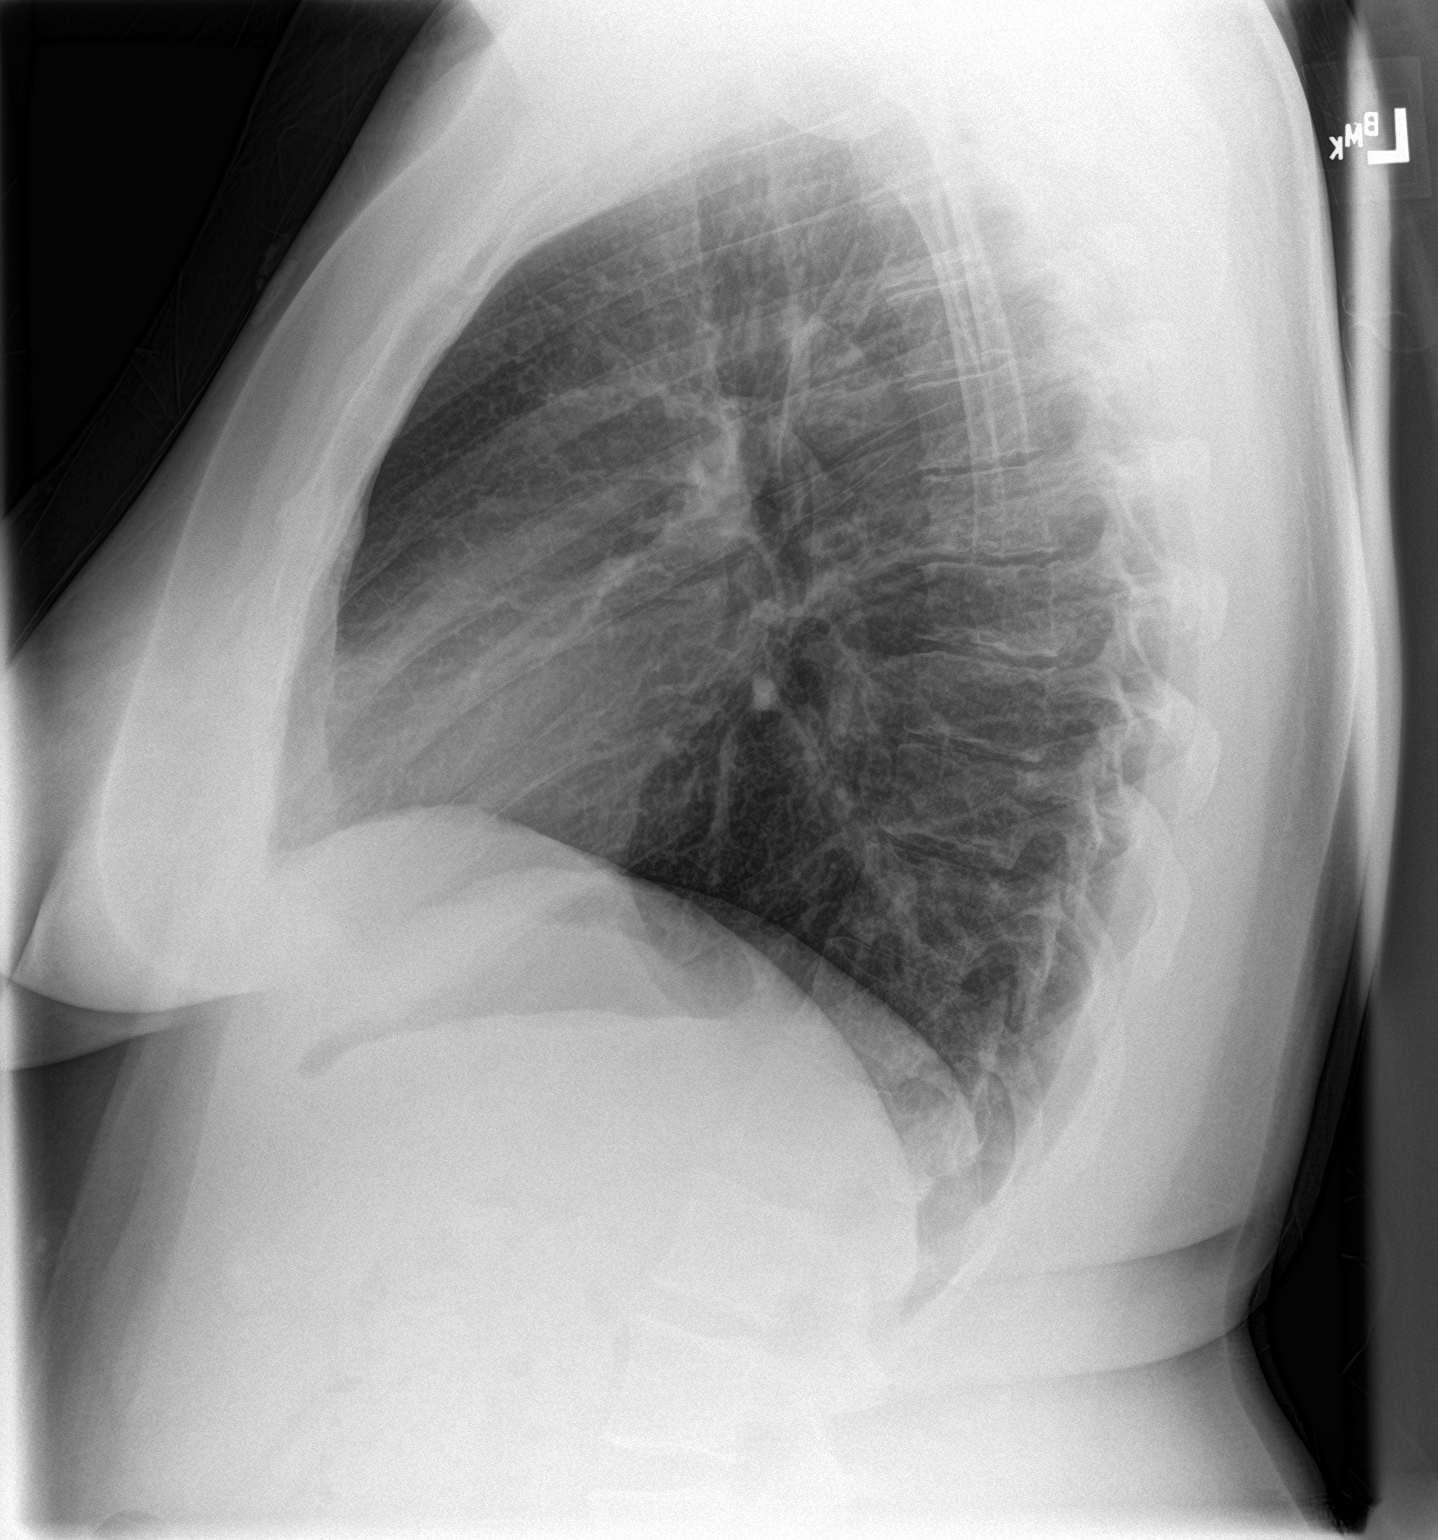

[2 of 2 positions shown; findings below may reference images not displayed]

FINDINGS: The heart size and mediastinal contours are within normal limits.
Both lungs are clear. The visualized skeletal structures are
unremarkable.
IMPRESSION: No active cardiopulmonary disease.

## 2021-01-12 NOTE — ED Triage Notes (Signed)
Pt report was driving to the movies with her husband and all the sudden had a wave go over her and her chest started hurting. Pt states she feels weird and is now dizzy.

## 2021-01-13 ENCOUNTER — Emergency Department
Admission: EM | Admit: 2021-01-13 | Discharge: 2021-01-13 | Disposition: A | Discharge status: Home / Self Care | Payer: Medicaid Other | Attending: Emergency Medicine | Admitting: Emergency Medicine

## 2021-01-13 DIAGNOSIS — R42 Dizziness and giddiness: Secondary | ICD-10-CM

## 2021-01-13 DIAGNOSIS — M5412 Radiculopathy, cervical region: Secondary | ICD-10-CM

## 2021-01-13 DIAGNOSIS — R519 Headache, unspecified: Secondary | ICD-10-CM

## 2021-01-13 LAB — TROPONIN I (HIGH SENSITIVITY): Troponin I (High Sensitivity): <2 ng/L (ref <18)

## 2021-01-13 MED ORDER — LIDOCAINE 5 % EX PTCH: 1.0000 | MEDICATED_PATCH | Freq: Two times a day (BID) | CUTANEOUS | 0 refills | Status: AC

## 2021-01-13 MED ORDER — CYCLOBENZAPRINE HCL 5 MG PO TABS: 5.0000 mg | ORAL_TABLET | Freq: Three times a day (TID) | ORAL | 0 refills | Status: AC | PRN

## 2021-01-13 NOTE — ED Provider Notes (Signed)
[facility] Emergency Department Provider Note   ____________________________________________   Event Date/Time   First MD Initiated Contact with Patient 01/13/21 [phone]     (approximate)  I have reviewed the triage vital signs and the nursing notes.   HISTORY  Chief Complaint Chest Pain and Nausea    HPI Melissa Terrell is a 35 y.o. female with past medical history of diabetes and hypothyroidism who presents to the ED complaining of dizziness and chest pain.  Patient reports that she has been dealing with intermittent lightheadedness and dizziness for about the past month.  It has been associated with bilateral headache.  She has been seen by her PCP and ENT for this problem, previously diagnosed with sinus infection.  She states that the symptoms seem to be improving following antibiotics and steroids but she had recurrent severe dizziness today.  This was associated with headache that has since resolved.  She additionally complains of pain in the left side of her neck and left chest which radiated down her left arm.  She felt a feeling of numbness and tingling in her left arm with a "heaviness."  The heaviness and tingling have improved although she complains of ongoing pain in her left neck.  She took ibuprofen earlier today for her headache which has since improved.        Past Medical History:  Diagnosis Date   Diabetes mellitus without complication ([place])    Encounter for insertion of mirena IUD 2013   Thyroid disease     Patient Active Problem List   Diagnosis Date Noted   Encounter for annual routine gynecological examination 04/16/2017   Attention deficit hyperactivity disorder (ADHD) 09/19/2011   Diabetes mellitus ([place]) 09/16/2011   Hypothyroidism 09/16/2011    Past Surgical History:  Procedure Laterality Date   NO PAST SURGERIES      Prior to Admission medications   Medication Sig Start Date End Date Taking? Authorizing Provider   cyclobenzaprine (FLEXERIL) 5 MG tablet Take 1 tablet (5 mg total) by mouth 3 (three) times daily as needed. 01/13/21  Yes Blake Divine, MD  lidocaine (LIDODERM) 5 % Place 1 patch onto the skin every 12 (twelve) hours. Remove & Discard patch within 12 hours or as directed by MD 01/13/21 01/13/22 Yes Blake Divine, MD  Blood Glucose Monitoring Suppl (FIFTY50 GLUCOSE METER 2.0) w/Device KIT Use as instructed diabetes 250.00 04/05/14   [provider]  doxycycline (VIBRAMYCIN) 100 MG capsule Take 1 capsule (100 mg total) by mouth 2 (two) times daily. 01/03/21   Sharion Balloon, NP  esomeprazole (NEXIUM) 20 MG capsule Take 20 mg by mouth daily. 12/11/20 12/11/21  [provider]  gabapentin (NEURONTIN) 300 MG capsule Take 1 capsule (300 mg total) by mouth 3 (three) times daily. 04/05/20   Margarette Canada, NP  insulin aspart (NOVOLOG) 100 UNIT/ML FlexPen Inject into the skin. 03/31/17 04/05/20  [provider]  Insulin Glargine (LANTUS SOLOSTAR) 100 UNIT/ML Solostar Pen Inject into the skin. 03/31/17 04/05/20  [provider]  Insulin Pen Needle 31G X 8 MM MISC Use for insulin pen 4 times daily E11.65 07/19/14   [provider]  levonorgestrel (MIRENA) 20 MCG/24HR IUD Frequency:UNKNOWN   Dosage:0.0     Instructions:  Note:Dose: 1 10/15/11   [provider]  levothyroxine (SYNTHROID, LEVOTHROID) 50 MCG tablet Take 50 mcg by mouth daily before breakfast.    [provider]  metFORMIN (GLUCOPHAGE) 500 MG tablet Take 500 mg by mouth.  03/31/17 04/05/20  [provider]  metFORMIN (GLUCOPHAGE) 500 MG tablet Take 500 mg by mouth daily. 10/13/20   [provider]  methylPREDNISolone (MEDROL DOSEPAK) 4 MG TBPK tablet Take according to package insert 04/05/20   Margarette Canada, NP  albuterol (PROVENTIL HFA;VENTOLIN HFA) 108 (90 Base) MCG/ACT inhaler Inhale 1-2 puffs into the lungs every 6 (six) hours as needed for wheezing or shortness of breath. Use  with spacer 07/21/18 04/05/20  Lorin Picket, PA-C    Allergies Penicillins  Family History  Problem Relation Age of Onset   Ovarian cancer Other    Diabetes Mother    Heart disease Father     Social History Social History   Tobacco Use   Smoking status: Former    Packs/day: 0.50    Types: Cigarettes   Smokeless tobacco: Never  Vaping Use   Vaping Use: Every day   Substances: Nicotine  Substance Use Topics   Alcohol use: No   Drug use: No    Review of Systems  Constitutional: No fever/chills Eyes: No visual changes. ENT: No sore throat. Cardiovascular: Positive for lightheadedness and chest pain. Respiratory: Denies shortness of breath. Gastrointestinal: No abdominal pain.  No nausea, no vomiting.  No diarrhea.  No constipation. Genitourinary: Negative for dysuria. Musculoskeletal: Negative for back pain.  Positive for neck pain. Skin: Negative for rash. Neurological: Positive for headaches, negative for focal weakness or numbness.  ____________________________________________   PHYSICAL EXAM:  VITAL SIGNS: ED Triage Vitals  Enc Vitals Group     BP 01/12/21 1853 (!) 133/94     Pulse Rate 01/12/21 1853 73     Resp 01/12/21 1853 20     Temp 01/12/21 1853 98.2 F (36.8 C)     Temp Source 01/12/21 1853 Oral     SpO2 01/12/21 1853 100 %     Weight 01/12/21 1850 200 lb (90.7 kg)     Height 01/12/21 1850 '5\' 4"'  (1.626 m)     Head Circumference --      Peak Flow --      Pain Score 01/12/21 1849 9     Pain Loc --      Pain Edu? --      Excl. in [place]? --     Constitutional: Alert and oriented. Eyes: Conjunctivae are normal. Head: Atraumatic.  TMs clear bilaterally. Nose: No congestion/rhinnorhea. Mouth/Throat: Mucous membranes are moist. Neck: Normal ROM Cardiovascular: Normal rate, regular rhythm. Grossly normal heart sounds.  2+ radial pulses bilaterally. Respiratory: Normal respiratory effort.  No retractions. Lungs CTAB. Gastrointestinal: Soft and  nontender. No distention. Genitourinary: deferred Musculoskeletal: No lower extremity tenderness nor edema. Neurologic:  Normal speech and language. No gross focal neurologic deficits are appreciated. Skin:  Skin is warm, dry and intact. No rash noted. Psychiatric: Mood and affect are normal. Speech and behavior are normal.  ____________________________________________   LABS (all labs ordered are listed, but only abnormal results are displayed)  Labs Reviewed  BASIC METABOLIC PANEL - Abnormal; Notable for the following components:      Result Value   Glucose, Bld 263 (*)    All other components within normal limits  CBC - Abnormal; Notable for the following components:   Platelets 491 (*)    All other components within normal limits  CBG MONITORING, ED - Abnormal; Notable for the following components:   Glucose-Capillary 251 (*)    All other components within normal limits  TROPONIN I (HIGH SENSITIVITY)  TROPONIN I (HIGH SENSITIVITY)  ____________________________________________  EKG  ED ECG REPORT I, Blake Divine, the attending physician, personally viewed and interpreted this ECG.   Date: 01/13/2021  EKG Time: 18:44  Rate: 71  Rhythm: normal sinus rhythm  Axis: Normal  Intervals:none  ST&T Change: None   PROCEDURES  Procedure(s) performed (including Critical Care):  Procedures   ____________________________________________   INITIAL IMPRESSION / ASSESSMENT AND PLAN / ED COURSE      35 year old female with past medical history of diabetes and hypothyroidism who presents to the ED complaining of intermittent lightheadedness and headache for the past month, today had a worsening episode associated with pain in her chest along with pain and heaviness radiating down her left arm.  EKG shows no evidence of arrhythmia or ischemia and 2 sets of troponin are negative, I doubt ACS.  I doubt PE as she is PERC negative, chest x-ray reviewed by me and shows no  infiltrate, edema, or effusion.  Remainder of labs are unremarkable, patient with no focal neurologic deficits on exam.  No evidence of stroke and symptoms more consistent with a cervical radiculopathy.  No reason to suspect SAH or meningitis at this time.  Patient is appropriate for discharge home and we will provide with neurology follow-up.  She was counseled to return to the ED for new or worsening symptoms, patient agrees with plan.      ____________________________________________   FINAL CLINICAL IMPRESSION(S) / ED DIAGNOSES  Final diagnoses:  Lightheadedness  Cervical radiculopathy  Acute nonintractable headache, unspecified headache type     ED Discharge Orders          Ordered    lidocaine (LIDODERM) 5 %  Every 12 hours        01/13/21 0254    cyclobenzaprine (FLEXERIL) 5 MG tablet  3 times daily PRN        01/13/21 0254             Note:  This document was prepared using Dragon voice recognition software and may include unintentional dictation errors.    Blake Divine, MD 01/13/21 [phone]

## 2021-01-30 ENCOUNTER — Encounter: Payer: Self-pay | Admitting: Physical Therapy

## 2021-01-30 ENCOUNTER — Other Ambulatory Visit: Payer: Self-pay

## 2021-01-30 ENCOUNTER — Ambulatory Visit: Payer: Medicaid Other | Attending: Family | Admitting: Physical Therapy

## 2021-01-30 VITALS — BP 110/79 | HR 88

## 2021-01-30 DIAGNOSIS — M542 Cervicalgia: Secondary | ICD-10-CM | POA: Insufficient documentation

## 2021-01-30 NOTE — Therapy (Addendum)
[facility] [address] [place], [place], [address] Phone: [phone]   Fax:  [phone]  Physical Therapy Evaluation  Patient Details  Name: Melissa Terrell MRN: [id number] Date of Birth: 1985-10-24 Referring Provider (PT): Etheleen Nicks NP   Encounter Date: 01/30/2021   Past Medical History:  Diagnosis Date   Diabetes mellitus without complication (H[facility])    Encounter for insertion of mirena IUD 2013   Thyroid disease     Past Surgical History:  Procedure Laterality Date   NO PAST SURGERIES      Vitals:   01/30/21 1722  BP: 110/79  Pulse: 88  SpO2: 99%    [facility] PT Assessment - 02/12/21 0001       Assessment   Medical Diagnosis cervical radiculopathy    Referring Provider (PT) Etheleen Nicks NP    Onset Date/Surgical Date 12/13/20    Hand Dominance Right    Next MD Visit 12/22    Prior Therapy None      Home Environment   Living Environment Private residence    Living Arrangements Children    Type of Home House    Home Access Level entry      Prior Function   Level of Independence Independent      Cognition   Overall Cognitive Status Within Functional Limits for tasks assessed              CERIVCAL TRIAGE    5 D's And 3 N's: Denies all   denies diplopia, drop attacks, dysarthria, dysphagia, ataxia of  gait, and nystagmus; denies lightheadedness, numbness and occasional nausea    MUSCULOSKELETAL:   ROM (gross): - UE ROM:                               Cervical.                     Normal               flex:    40          50                     ext:   40           60           R        L            Normal     SB:   40     50              45  rot:    60     60             70-90                                 R      L    Normal   - shoulder flx     180 180          180  - shoulder abd    180 180         180  - shoulder ext     60    60             60  -  combined  mvt      (touch pt)         - ER       Occiput  Occiput  Occiput         - IR            T7        T7            T7 ER @ 90 deg abd   110 110            90 IR @ 90 deg abd    70   70              70    STRENGTH   Strength R/L 5/5 Shoulder flexion (anterior deltoid/pec major/coracobrachialis, axillary n. (C5/6) and musculocutaneous n. (C5-7)) 5/5 Shoulder abduction (deltoid/supraspinatus, axillary/suprascapular n, C5) 5/5 Shoulder external rotation (infraspinatus/teres minor) 5/5 Shoulder internal rotation (subcapularis/lats/pec major) 5/5 Shoulder extension (posterior deltoid, lats, teres major, axillary/thoracodorsal n.) 5/5 Shoulder horizontal abduction 5/5 Elbow flexion (biceps brachii, brachialis, brachioradialis, musculoskeletal n, C5/6) 5/5 Elbow extension (triceps, radial n, C7) 5/5 Wrist Extension (C6/7) 5/5 Wrist Flexion (C6/7) 5/5 Finger adduction (interossei, ulnar n, T1) Cervical isometrics are strong in all directions;  Grip Good Good   MYOTOMES                                R     L  Cervical Rotation (C1)        Shoulder shrug (C2-C4):   5/5    5/5               abd (C5):   5/5    5/5        Elbow flex (C6):   5/5    5/5               ext (C7):   5/5    5/5        Wrist flex (C7):   5/5    5/5               ext (C6):   5/5    5/5      Finger abd (C8/T1):   5/5    5/5  SENSATION  C2-T2 Intact bilaterally                                              ------------------------------------------  Special Tests:                       Cervical Radiculopathy Test cluster:   -rotation AROM <60 deg towards symptomatic side?: Negative   -Spurling's test: Negative    -response to manual traction: NT    -upper limb tension test A: + on Right with Shoulder abduction   Palpation/Accessory: Left trap tightness    Plan - 02/12/21 0814     Clinical Impression Statement Pt is a 35 yo female that presents with left sided neck pain and lateral and superior head pain.  She demonstrates decreased cervical ROM likely due to left upper trap stiffness. Unable to rule in cervical radiculopathy and completely rule it out with positive ULTT and negative cervical rotation and spurling's. 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    Personal Factors and Comorbidities Comorbidity 1    Comorbidities T2DM    Examination-Activity Limitations Bend    Examination-Participation Restrictions Occupation;Other   Cookie Making   Stability/Clinical Decision Making Stable/Uncomplicated    Rehab Potential Good    PT Frequency 2x / week    PT Duration Other (comment)   5   PT Treatment/Interventions Traction;Moist Heat;Electrical Stimulation;Therapeutic activities;Therapeutic exercise;Joint Manipulations;Manual techniques    PT Next Visit Plan Cervical joint play, periscapular strengthening exercises, activitiy modification    PT Home Exercise Plan [email or web address]    Consulted and Agree with Plan of Care Patient                Patient will benefit from skilled therapeutic intervention in order to improve the following deficits and impairments:  Pain, Postural dysfunction, Decreased range of motion, Impaired flexibility  Visit Diagnosis: Cervicalgia - Plan: PT plan of care cert/re-cert    PT Short Term Goals - 02/08/21 1531       PT SHORT TERM GOAL #1   Title Patient will demonstrate understanding of home exercise plan.    Baseline 8/9: NT    Time 2    Period Weeks    Status New    Target Date 02/13/21             PT Long Term Goals - 02/08/21 1531       PT LONG TERM GOAL #1   Title Patient will have improved function and activity level as evidenced by an increase in FOTO score by 10 points or more.    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    Baseline 8/9: R Lateral SB 40, flex 40, ext 40    Time 5    Period Weeks    Status New               HEP includes following exercise:  Access Code: [email or web address] URL: [email or web address] Date: 01/30/2021 Prepared by: Ellin Goodie  Exercises Seated Upper Trapezius Stretch - 1 x daily - 7 x weekly - 1 sets - 5 reps - 30 hold Doorway Pec Stretch at 90 Degrees Abduction - 1 x daily - 7 x weekly - 1 sets - 5 reps - 30 hold   Problem List Patient Active Problem List   Diagnosis Date Noted   Encounter for annual routine gynecological examination 04/16/2017   Attention deficit hyperactivity disorder (ADHD) 09/19/2011   Diabetes mellitus (HCC) 09/16/2011   Hypothyroidism 09/16/2011   Ellin Goodie PT, DPT  02/12/2021, 8:07 AM  [facility] [address] [place], [address] Phone: [phone]   Fax:  [phone]  Name: Melissa Terrell MRN: [id number] Date of Birth: [birth year]

## 2021-02-07 ENCOUNTER — Ambulatory Visit: Payer: Medicaid Other | Admitting: Physical Therapy

## 2021-02-07 ENCOUNTER — Other Ambulatory Visit: Payer: Self-pay

## 2021-02-07 DIAGNOSIS — M542 Cervicalgia: Secondary | ICD-10-CM | POA: Diagnosis not present
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
## 2021-02-12 ENCOUNTER — Ambulatory Visit: Payer: Medicaid Other | Admitting: Physical Therapy

## 2021-02-12 DIAGNOSIS — M542 Cervicalgia: Secondary | ICD-10-CM

## 2021-02-12 NOTE — Therapy (Signed)
[place] [address], [place], [address] Phone: [phone]   Fax:  [phone]  Physical Therapy Treatment  Patient Details  Name: Melissa Terrell MRN: [id number] Date of Birth: [birth year] Referring Provider (PT): Etheleen Nicks NP   Encounter Date: 02/12/2021   PT End of Session - 02/12/21 0935     Visit Number 3    Number of Visits 11    Date for PT Re-Evaluation 03/06/21    PT Start Time 0845    PT Stop Time 0930    PT Time Calculation (min) 45 min    Activity Tolerance Patient tolerated treatment well    Behavior During Therapy [facility] for tasks assessed/performed             Past Medical History:  Diagnosis Date   Diabetes mellitus without complication (HCC)    Encounter for insertion of mirena IUD 2013   Thyroid disease     Past Surgical History:  Procedure Laterality Date   NO PAST SURGERIES      There were no vitals filed for this visit.   Subjective Assessment - 02/12/21 0848     Subjective Pt reports that her symptoms have improved since doing trigger point release. She did note an improvement after last session but relief went away by Saturday. She is experiencing GERD and lays on pillows to keep her head propped up.    Pertinent History Patient presents for evaluation of neck pain. Location of pain: left side of neck with radiation to left shoulder and left arm. Inciting event: none known. Onset of symptoms was 1 month ago. Associated symptoms: headache - primarily occipital, neck stiffness, left arm weakness, occasional tingling in left arm. She was evaluated at [facility] 7/23/2, in which hospitalist noted suspicion for cervical radiculopathy. Patient requests referral to PT.     Patient has been seen at the ED x 3 in the past month sinusitis and vertigo symptoms. Last ED visit 01/14/21 for dizziness. Hospitalist suspected benign vertigo potentially secondary to vestibular neuritis  versus BPPV. She was discharged with prescription for meclizine and Zofran, and referral back to ENT and neurology.    Limitations Other (comment)   Bending over and she cannot be exposed to heat for long   How long can you sit comfortably? N/a    How long can you stand comfortably? N/a    How long can you walk comfortably? N/a    Patient Stated Goals To decrease her neck pain    Pain Onset More than a month ago            MANUAL:  Upper Trap Stretch  SCM massage  Suboccipital Release    THEREX:  Cervical R + L Side bending with YTB 2 x 10 -VC to maintain head straight.  Cervical R + L Rotation with YTB 2 x 10  -VC to increase tension in YTB   Education on work station setup and importance of keeping monitor at eye level to prevent forward head and rounded shoulders.   Education on use of wedge to avoid pillows placing head in increased flexion and to avoid GERD symptoms when laying flat.  Updated HEP and educated patient on changes to exercises and addition of new exercises        PT Education - 02/12/21 0934     Education Details form/technique for appropriate exercise. Education about workspace modification and use of pillow wedge to improve sleeping posture  Person(s) Educated Patient    Methods Explanation;Demonstration;Verbal cues    Comprehension Verbalized understanding;Returned demonstration;Verbal cues required              PT Short Term Goals - 02/12/21 0939       PT SHORT TERM GOAL #1   Title Patient will demonstrate understanding of home exercise plan.    Baseline 8/9: NT    Time 2    Period Weeks    Status New    Target Date 02/13/21               PT Long Term Goals - 02/12/21 0939       PT LONG TERM GOAL #1   Title Patient will have improved function and activity level as evidenced by an increase in FOTO score by 10 points or more.    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    Baseline 8/9: R Lateral SB 40, flex 40, ext 40    Time 5    Period Weeks    Status New                   Plan - 02/12/21 0935     Clinical Impression Statement Pt presents for f/u for left sided neck pain and lateral and superior head pain. She able to complete all exercises without experiencing and increase in her symptoms. PT educated pt on activity modification to improve sleeping and sitting posture to prevent muscle tension. Pt demonstrates understanding and she will journal on patterns that she notices with onset of neurological symptoms such as tingling in head.  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    Personal Factors and Comorbidities Comorbidity 1    Comorbidities T2DM    Examination-Activity Limitations Bend    Examination-Participation Restrictions Occupation;Other   Cookie Making   Stability/Clinical Decision Making Stable/Uncomplicated    Rehab Potential Good    PT Frequency 2x / week    PT Duration Other (comment)   5   PT Treatment/Interventions Traction;Moist Heat;Electrical Stimulation;Therapeutic activities;Therapeutic exercise;Joint Manipulations;Manual techniques    PT Next Visit Plan cervical soft tissue work, periscapular strengthening exercises, activitiy modification    PT Home Exercise Plan [email or web address]    Consulted and Agree with Plan of Care Patient            HEP includes following exercises:      Patient will benefit from skilled therapeutic intervention in order to improve the following deficits and impairments:  Pain, Postural dysfunction, Decreased range of motion, Impaired  flexibility  Visit Diagnosis: Cervicalgia  Access Code: [email or web address] URL: [email or web address] Date: 02/12/2021 Prepared by: Ellin Goodie  Exercises Seated Upper Trapezius Stretch - 1 x daily - 7 x weekly - 1 sets - 5 reps - 30 hold Doorway Pec Stretch at 90 Degrees Abduction - 1 x daily - 7 x weekly - 1 sets - 5 reps - 30 hold Sternocleidomastoid Stretch - 1 x daily - 7 x weekly - 1 sets - 5 reps -  60 hold Seated Cervical Retraction - 1 x daily - 3 x weekly - 3 sets - 10 reps Seated Shoulder Horizontal Abduction with Resistance - 1 x daily - 3 x weekly - 3 sets - 10 reps Standing Cervical Rotation with Anchored Resistance - 1 x daily - 7 x weekly - 3 sets - 10 reps Standing Cervical Sidebending with Anchored Resistance - 1 x daily - 7 x weekly - 3 sets - 10 reps  Cervical extension with YTB 3 x 10 not included on sheet   Problem List Patient Active Problem List   Diagnosis Date Noted   Encounter for annual routine gynecological examination 04/16/2017   Attention deficit hyperactivity disorder (ADHD) 09/19/2011   Diabetes mellitus (HCC) 09/16/2011   Hypothyroidism 09/16/2011   Ellin Goodie PT, DPT  02/12/2021, 9:48 AM  [facility] [address], [place], [address] Phone: [phone]   Fax:  [phone]  Name: Melissa Terrell MRN: [id number] Date of Birth: [birth year]

## 2021-02-13 ENCOUNTER — Encounter: Payer: Medicaid Other | Admitting: Physical Therapy

## 2021-02-15 ENCOUNTER — Ambulatory Visit: Payer: Medicaid Other | Admitting: Physical Therapy

## 2021-02-21 ENCOUNTER — Ambulatory Visit: Payer: Medicaid Other | Admitting: Physical Therapy

## 2021-02-21 DIAGNOSIS — M542 Cervicalgia: Secondary | ICD-10-CM

## 2021-02-21 NOTE — Therapy (Signed)
[facility] [address], [place], [address] Phone: [phone]   Fax:  [phone]  Physical Therapy Treatment  Patient Details  Name: Melissa Terrell MRN: [id number] Date of Birth: 10-Sep-1985 Referring Provider (PT): Etheleen Nicks NP   Encounter Date: 02/21/2021   PT End of Session - 02/21/21 0857     Visit Number 4    Number of Visits 11    Date for PT Re-Evaluation 03/06/21    PT Start Time 0805    PT Stop Time 0845    PT Time Calculation (min) 40 min    Activity Tolerance Patient tolerated treatment well    Behavior During Therapy [facility] for tasks assessed/performed             Past Medical History:  Diagnosis Date   Diabetes mellitus without complication (HCC)    Encounter for insertion of mirena IUD 2013   Thyroid disease     Past Surgical History:  Procedure Laterality Date   NO PAST SURGERIES      There were no vitals filed for this visit.   Subjective Assessment - 02/21/21 0808     Subjective Pt reports recent visit to ER for stomach ulcer and her and her family are recovering from strep throat. She has not been able to do her exercises as a result.    Pertinent History Patient presents for evaluation of neck pain. Location of pain: left side of neck with radiation to left shoulder and left arm. Inciting event: none known. Onset of symptoms was 1 month ago. Associated symptoms: headache - primarily occipital, neck stiffness, left arm weakness, occasional tingling in left arm. She was evaluated at [facility] 7/23/2, in which hospitalist noted suspicion for cervical radiculopathy. Patient requests referral to PT.     Patient has been seen at the ED x 3 in the past month sinusitis and vertigo symptoms. Last ED visit 01/14/21 for dizziness. Hospitalist suspected benign vertigo potentially secondary to vestibular neuritis versus BPPV. She was discharged with prescription for meclizine and Zofran,  and referral back to ENT and neurology.    Limitations Other (comment)   Bending over and she cannot be exposed to heat for long   How long can you sit comfortably? N/a    How long can you stand comfortably? N/a    How long can you walk comfortably? N/a    Patient Stated Goals To decrease her neck pain    Pain Onset More than a month ago             MANUAL THERAPY:  Suboccipital Release  Suboccipital Massage  SCM massage  Trigger Point Release of Upper Traps  Upper Trap Stretch  Neck Extensor Stretch  Masseter massage and trigger point release   THEREX:  Seated Shoulder T's using Yellow Theraband 2 x 10  Wall Push Up with Plus    Updated HEP and educated patient on changes to exercises and addition of new exercises       PT Education - 02/21/21 0837     Education Details form/technique for appropriate exercise    Person(s) Educated Patient    Methods Explanation;Demonstration;Handout;Verbal cues    Comprehension Verbalized understanding;Verbal cues required              PT Short Term Goals - 02/21/21 0906       PT SHORT TERM GOAL #1   Title Patient will demonstrate understanding of home exercise plan.  Baseline 8/9: NT    Time 2    Period Weeks    Status New    Target Date 02/13/21               PT Long Term Goals - 02/21/21 0906       PT LONG TERM GOAL #1   Title Patient will have improved function and activity level as evidenced by an increase in FOTO score by 10 points or more.    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    Baseline 8/9: R Lateral SB 40, flex 40, ext 40    Time 5    Period Weeks    Status New                   Plan - 02/21/21 0857      Clinical Impression Statement Pt presents for f/u for signs and symptoms suggestive of occipital neuralgia with left sided neck pain and lateral and superior head pain. She has been limited in compliance with HEP due to ongoing sickness and GI issues, but has found relief with soft tissue techniques and desk setup. She was able to complete all exercises without an increase in her neck pain or N/T and she did demonstrate muscle tension relief of upper left trap with soft tissue work.  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    Personal Factors and Comorbidities Comorbidity 1    Comorbidities T2DM    Examination-Activity Limitations Bend    Examination-Participation Restrictions Occupation;Other   Cookie Making   Stability/Clinical Decision Making Stable/Uncomplicated    Rehab Potential Good    PT Frequency 2x / week    PT Duration Other (comment)   5   PT Treatment/Interventions Traction;Moist Heat;Electrical Stimulation;Therapeutic activities;Therapeutic exercise;Joint Manipulations;Manual techniques    PT Next Visit Plan Review of postural modification for desk setup, soft tissue work, and progress periscapular strengthening exercise.    PT Home Exercise Plan [email or web address]    Consulted and Agree with Plan of Care Patient            HEP includes:   Access Code: [email or web address] URL: [email or web address] Date: 02/21/2021 Prepared by: Ellin Goodie  Exercises Seated Upper Trapezius Stretch - 1 x daily - 7 x weekly - 1 sets - 5 reps - 30 hold Doorway Pec Stretch at 90 Degrees Abduction - 1 x daily - 7 x weekly - 1 sets - 5 reps - 30 hold Sternocleidomastoid Stretch - 1 x daily - 7 x weekly - 1 sets - 5 reps - 60 hold Seated Cervical Retraction - 1 x daily - 3 x weekly - 3 sets - 10 reps Seated Shoulder Horizontal Abduction with  Resistance - 1 x daily - 3 x weekly - 3 sets - 10 reps Standing Cervical Rotation with Anchored Resistance - 1 x daily - 7 x weekly - 3 sets - 10 reps Standing Cervical Sidebending with Anchored Resistance - 1 x daily - 7 x weekly - 3 sets - 10  reps Wall Push Up with Plus - 1 x daily - 3 x weekly - 3 sets - 15 reps     Patient will benefit from skilled therapeutic intervention in order to improve the following deficits and impairments:  Pain, Postural dysfunction, Decreased range of motion, Impaired flexibility  Visit Diagnosis: Cervicalgia     Problem List Patient Active Problem List   Diagnosis Date Noted   Encounter for annual routine gynecological examination 04/16/2017   Attention deficit hyperactivity disorder (ADHD) 09/19/2011   Diabetes mellitus (HCC) 09/16/2011   Hypothyroidism 09/16/2011   Ellin Goodie PT, DPT  02/21/2021, 9:07 AM  [place] [facility] [address] [place], [address] Phone: [phone]   Fax:  [phone]  Name: MARDEL GRUDZIEN MRN: [id number] Date of Birth: 1985-09-22

## 2021-02-27 ENCOUNTER — Encounter: Payer: Medicaid Other | Admitting: Physical Therapy

## 2021-03-01 ENCOUNTER — Ambulatory Visit: Payer: Medicaid Other | Attending: Family | Admitting: Physical Therapy

## 2021-03-01 DIAGNOSIS — M542 Cervicalgia: Secondary | ICD-10-CM | POA: Diagnosis not present

## 2021-03-01 NOTE — Therapy (Signed)
[facility] [address], [place], [address] Phone: [phone]   Fax:  [phone]  Physical Therapy Treatment  Patient Details  Name: Melissa Terrell MRN: [id number] Date of Birth: 1986-03-07 Referring Provider (PT): Etheleen Nicks NP   Encounter Date: 03/01/2021   PT End of Session - 03/01/21 1412     Visit Number 5    Number of Visits 11    Date for PT Re-Evaluation 03/06/21    PT Start Time 0845    PT Stop Time 0930    PT Time Calculation (min) 45 min    Activity Tolerance Patient tolerated treatment well    Behavior During Therapy [facility] for tasks assessed/performed             Past Medical History:  Diagnosis Date   Diabetes mellitus without complication (HCC)    Encounter for insertion of mirena IUD 2013   Thyroid disease     Past Surgical History:  Procedure Laterality Date   NO PAST SURGERIES      There were no vitals filed for this visit.   Subjective Assessment - 03/01/21 0849     Subjective Pt reports feeling more stressed as of late because she recently quit smoking. Her neck muscles feel more tense and she has been experiencing more headaches as of late.    Pertinent History Patient presents for evaluation of neck pain. Location of pain: left side of neck with radiation to left shoulder and left arm. Inciting event: none known. Onset of symptoms was 1 month ago. Associated symptoms: headache - primarily occipital, neck stiffness, left arm weakness, occasional tingling in left arm. She was evaluated at [facility] 7/23/2, in which hospitalist noted suspicion for cervical radiculopathy. Patient requests referral to PT.     Patient has been seen at the ED x 3 in the past month sinusitis and vertigo symptoms. Last ED visit 01/14/21 for dizziness. Hospitalist suspected benign vertigo potentially secondary to vestibular neuritis versus BPPV. She was discharged with prescription for meclizine and  Zofran, and referral back to ENT and neurology.    Limitations Other (comment)   Bending over and she cannot be exposed to heat for long   How long can you sit comfortably? N/a    How long can you stand comfortably? N/a    How long can you walk comfortably? N/a    Patient Stated Goals To decrease her neck pain    Currently in Pain? Yes    Pain Score 4     Pain Location Neck    Pain Onset More than a month ago            MANUAL:  Suboccipital Release  Suboccipital Massage  SCM massage  Trigger Point Release of Upper Traps  Upper Trap Stretch  Neck Extensor Stretch  Masseter massage and trigger point release    THEREX:  Banded T's Yellow Theraband 2 x 10  D2 Shoulder Flexion with Yellow Theraband 2 x 10    Updated HEP and educated patient on changes to exercises and addition of new exercises. Added D2 flexion exercise.          PT Education - 03/01/21 [phone]     Education Details form/technique for appropriate exercise    Person(s) Educated Patient    Methods Explanation;Demonstration;Verbal cues;Handout    Comprehension Verbalized understanding;Verbal cues required;Returned demonstration              PT Short Term Goals -  03/01/21 1416       PT SHORT TERM GOAL #1   Title Patient will demonstrate understanding of home exercise plan.    Baseline 8/9: NT    Time 2    Period Weeks    Status New    Target Date 02/13/21               PT Long Term Goals - 03/01/21 1416       PT LONG TERM GOAL #1   Title Patient will have improved function and activity level as evidenced by an increase in FOTO score by 10 points or more.    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    Baseline 8/9: R  Lateral SB 40, flex 40, ext 40    Time 5    Period Weeks    Status New             HEP includes:   Access Code: [email or web address] URL: [email or web address] Date: 03/01/2021 Prepared by: Ellin Goodie  Exercises Seated Upper Trapezius Stretch - 1 x daily - 7 x weekly - 1 sets - 5 reps - 30 hold Doorway Pec Stretch at 90 Degrees Abduction - 1 x daily - 7 x weekly - 1 sets - 5 reps - 30 hold Sternocleidomastoid Stretch - 1 x daily - 7 x weekly - 1 sets - 5 reps - 60 hold Seated Cervical Retraction - 1 x daily - 3 x weekly - 3 sets - 10 reps Seated Shoulder Horizontal Abduction with Resistance - 1 x daily - 3 x weekly - 3 sets - 10 reps Standing Cervical Rotation with Anchored Resistance - 1 x daily - 7 x weekly - 3 sets - 10 reps Standing Cervical Sidebending with Anchored Resistance - 1 x daily - 7 x weekly - 3 sets - 10 reps Wall Push Up with Plus - 1 x daily - 3 x weekly - 3 sets - 15 reps Shoulder PNF D2 Flexion - 1 x daily - 3 x weekly - 3 sets - 10 reps       Plan - 03/01/21 1413     Clinical Impression Statement Pt presents for f/u for cervicalgia and increased cervical muscular tension with increased level of stress. She shows decreased muscular tension after trigger point release and massage of cervical musculature. HEP not progressed due to limited compliance with recent stressors in patient's life. 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    Personal Factors and Comorbidities Comorbidity 1    Comorbidities T2DM    Examination-Activity Limitations Bend    Examination-Participation Restrictions Occupation;Other   Cookie Making   Stability/Clinical Decision Making Stable/Uncomplicated    Rehab Potential Good    PT Frequency 2x / week    PT Duration Other (comment)   5   PT Treatment/Interventions Traction;Moist  Heat;Electrical Stimulation;Therapeutic activities;Therapeutic exercise;Joint Manipulations;Manual techniques    PT Next Visit Plan Reasssess goals and progress periscapular, cervical, and shoulder strengthening exercises    PT Home Exercise Plan [email or web address]  Consulted and Agree with Plan of Care Patient             Patient will benefit from skilled therapeutic intervention in order to improve the following deficits and impairments:  Pain, Postural dysfunction, Decreased range of motion, Impaired flexibility  Visit Diagnosis: Cervicalgia     Problem List Patient Active Problem List   Diagnosis Date Noted   Encounter for annual routine gynecological examination 04/16/2017   Attention deficit hyperactivity disorder (ADHD) 09/19/2011   Diabetes mellitus (HCC) 09/16/2011   Hypothyroidism 09/16/2011   Ellin Goodie PT, DPT  03/01/2021, 2:18 PM  [place] [facility] [address] [place], [address] Phone: [phone]   Fax:  [phone]  Name: BARBY COLVARD MRN: [id number] Date of Birth: [birth year]

## 2021-03-05 ENCOUNTER — Ambulatory Visit: Payer: Medicaid Other | Admitting: Physical Therapy

## 2021-03-05 DIAGNOSIS — M542 Cervicalgia: Secondary | ICD-10-CM

## 2021-03-05 NOTE — Therapy (Signed)
[place] [facility] [address] [place], [address] Phone: [phone]   Fax:  [phone]  Physical Therapy Treatment/ Recertification  Patient Details  Name: Melissa Terrell MRN: [id number] Date of Birth: 02/21/1986 Referring Provider (PT): Verita Lamb NP   Encounter Date: 03/05/2021   PT End of Session - 03/05/21 1110     Visit Number 6    Number of Visits 11    Date for PT Re-Evaluation 03/06/21    PT Start Time 0800    PT Stop Time 0835    PT Time Calculation (min) 35 min    Activity Tolerance Patient tolerated treatment well    Behavior During Therapy [facility] for tasks assessed/performed             Past Medical History:  Diagnosis Date   Diabetes mellitus without complication ([place])    Encounter for insertion of mirena IUD 2013   Thyroid disease     Past Surgical History:  Procedure Laterality Date   NO PAST SURGERIES      There were no vitals filed for this visit.   Subjective Assessment - 03/05/21 1023     Subjective Pt reports that despite stretches helping her neck tension, she is still experiencing sudden bouts of tension in her neck followed by dizziness and numbness and tinlging on the top of her head.    Pertinent History Patient presents for evaluation of neck pain. Location of pain: left side of neck with radiation to left shoulder and left arm. Inciting event: none known. Onset of symptoms was 1 month ago. Associated symptoms: headache - primarily occipital, neck stiffness, left arm weakness, occasional tingling in left arm. She was evaluated at [facility] 7/23/2, in which hospitalist noted suspicion for cervical radiculopathy. Patient requests referral to PT.     Patient has been seen at the ED x 3 in the past month sinusitis and vertigo symptoms. Last ED visit 01/14/21 for dizziness. Hospitalist suspected benign vertigo potentially secondary to vestibular neuritis versus BPPV. She was  discharged with prescription for meclizine and Zofran, and referral back to ENT and neurology.    Limitations Other (comment)   Bending over and she cannot be exposed to heat for long   How long can you sit comfortably? N/a    How long can you stand comfortably? N/a    How long can you walk comfortably? N/a    Patient Stated Goals To decrease her neck pain    Currently in Pain? No/denies    Pain Onset More than a month ago            PHYSICAL PERFORMANCE:   Cervical Flexion AROM 45 deg  Cervical Extension AROM 45 deg  Cervical SB R AROM 45 deg     MANUAL:   Suboccipital Release  Suboccipital Massage  SCM massage  Trigger Point Release of Upper Traps  Upper Trap Stretch  Neck Extensor Stretch  Neck Flexor Stretch  Masseter massage and trigger point release    THEREX:  Neck Flexor Stretch 2 x 60 sec  Neck Extensor Stretch 2 x 60 sec  SCM Stretch 4 x 60 sec  -min VC to change hold on collar bone to increase stretch  -min VC to utilize ball or hand to massage muscle along SCM insertion and origin.        PT Education - 03/05/21 0808     Education Details form/technique for appropriate exercise    Person(s) Educated  Patient    Methods Explanation;Demonstration;Handout;Verbal cues    Comprehension Verbalized understanding;Returned demonstration;Verbal cues required              PT Short Term Goals - 03/05/21 0815       PT SHORT TERM GOAL #1   Title Patient will demonstrate understanding of home exercise plan.    Baseline 8/9: NT 9/12: Consistently completes exercises    Time 2    Period Weeks    Status Achieved    Target Date 02/13/21               PT Long Term Goals - 03/05/21 8841       PT LONG TERM GOAL #1   Title Patient will have improved function and activity level as evidenced by an increase in FOTO score by 10 points or more.    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    Baseline 8/9: R Lateral SB 40, flex 40, ext 40 9/12: R Lateral SB 45, flex 45, ext 40    Time 5    Period Weeks    Status Partially Met                   Plan - 03/05/21 1110     Clinical Impression Statement Pt presents for f/u for cervicalgia and potential occipital neuralgia. She continues to report sporadic episodes of neck pain accompanied by dizziness and numbness and tingling that radiate up through her scalp that worsen with stress. She does exhibit improvement in cervical motion during today's session. PT has decreased pain and tension in cervical muscles, but it has not resolved the onset of symptoms. PT recommends that pt trial PT for one more week to see if there is resolution of symptoms before terminating PT. PT also recommends that pt contact neurologist for additional medical management. Pt has already reached out to neurologist and has an apt scheduled for late September.    Personal Factors and Comorbidities Comorbidity 1    Comorbidities T2DM    Examination-Activity Limitations Bend    Examination-Participation Restrictions Occupation;Other   Cookie Making   Stability/Clinical Decision Making Stable/Uncomplicated    Rehab Potential Good    PT Frequency 2x / week    PT Duration Other (comment)   5   PT Treatment/Interventions Traction;Moist Heat;Electrical Stimulation;Therapeutic activities;Therapeutic exercise;Joint Manipulations;Manual techniques    PT Next Visit Plan Manual for upper trap and SCM tension and progress periscapular workout    PT Home Exercise Plan [email or web address]    Consulted and Agree with Plan of Care Patient             Patient will benefit from skilled therapeutic intervention in order to improve the following deficits and  impairments:  Pain, Postural dysfunction, Decreased range of motion, Impaired flexibility  Visit Diagnosis: Cervicalgia     Problem List Patient Active Problem List   Diagnosis Date Noted   Encounter for annual routine gynecological examination 04/16/2017   Attention deficit hyperactivity disorder (ADHD) 09/19/2011   Diabetes mellitus ([place]) 09/16/2011   Hypothyroidism 09/16/2011    Daneil Dan, PT 03/05/2021, 11:22 AM  [place]  [place] [facility] [address] [place], [address] Phone: [phone]   Fax:  [phone]  Name: Melissa Terrell MRN: [id number] Date of Birth: Dec 15, 1985

## 2021-03-07 ENCOUNTER — Ambulatory Visit: Payer: Medicaid Other | Admitting: Physical Therapy

## 2021-03-07 DIAGNOSIS — M542 Cervicalgia: Secondary | ICD-10-CM

## 2021-03-07 NOTE — Therapy (Signed)
[place] [facility] [address], [place], [address] Phone: [phone]   Fax:  [phone]  Physical Therapy Treatment/ Discharge Summary    Episode of Care: 01/30/21- 03/07/21  Patient Details  Name: Melissa Terrell MRN: [id number] Date of Birth: 02-12-1986 Referring Provider (PT): Verita Lamb NP   Encounter Date: 03/07/2021   PT End of Session - 03/07/21 0811     Visit Number 7    Number of Visits 11    Date for PT Re-Evaluation 03/06/21    PT Start Time 0800    Activity Tolerance Patient tolerated treatment well    Behavior During Therapy [facility] for tasks assessed/performed             Past Medical History:  Diagnosis Date   Diabetes mellitus without complication ([place])    Encounter for insertion of mirena IUD 2013   Thyroid disease     Past Surgical History:  Procedure Laterality Date   NO PAST SURGERIES      There were no vitals filed for this visit.   Subjective Assessment - 03/07/21 0809     Subjective She reports that she has increased dizziness and she believes it is from medication along with neck tightness. These symptoms along with numbness and tingling in scalp happen sporadically and they usually follow after increased stress or panic attacks.    Pertinent History Patient presents for evaluation of neck pain. Location of pain: left side of neck with radiation to left shoulder and left arm. Inciting event: none known. Onset of symptoms was 1 month ago. Associated symptoms: headache - primarily occipital, neck stiffness, left arm weakness, occasional tingling in left arm. She was evaluated at [facility] 7/23/2, in which hospitalist noted suspicion for cervical radiculopathy. Patient requests referral to PT.     Patient has been seen at the ED x 3 in the past month sinusitis and vertigo symptoms. Last ED visit 01/14/21 for dizziness. Hospitalist suspected benign vertigo potentially secondary to  vestibular neuritis versus BPPV. She was discharged with prescription for meclizine and Zofran, and referral back to ENT and neurology.    Limitations Other (comment)   Bending over and she cannot be exposed to heat for long   How long can you sit comfortably? N/a    How long can you stand comfortably? N/a    How long can you walk comfortably? N/a    Patient Stated Goals To decrease her neck pain    Currently in Pain? Yes    Pain Score 3     Pain Location Neck    Pain Orientation Left    Pain Descriptors / Indicators Aching    Pain Onset More than a month ago            MANUAL:   Masseter massage  Suboccipital massage  Upper trap stretch  Trigger point release of upper traps  Massage of upper cervical muscles  Massage of SCM  Forward Flexion Stretch    THEREX:   Levator Scap Stretch 2 x 30 sec  Seated Rows with Black TB 2 x 10    Discussion about need for further medical management with neurologist given neurological symptoms.   Updated HEP and educated patient on changes to exercises and addition of new exercises that include levator scap stretch and seated rows.       PT Education - 03/07/21 0856     Education Details form/technique for appropriat exercise. Explanation about plan of  care and need for futher medical f/u.    Person(s) Educated Patient    Methods Explanation;Demonstration;Verbal cues;Handout    Comprehension Verbalized understanding;Returned demonstration;Verbal cues required              PT Short Term Goals - 03/07/21 0855       PT SHORT TERM GOAL #1   Title Patient will demonstrate understanding of home exercise plan.    Baseline 8/9: NT 9/12: Consistently completes exercises    Time 2    Period Weeks    Status Achieved    Target Date 02/13/21               PT Long Term Goals - 03/07/21 0855       PT LONG TERM GOAL #1   Title Patient will have improved function and activity level as evidenced by an increase in FOTO score  by 10 points or more.    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    Baseline 8/9: R Lateral SB 40, flex 40, ext 40 9/12: R Lateral SB 45, flex 45, ext 40    Time 5    Period Weeks    Status Not Met                   Plan - 03/07/21 0807     Clinical Impression Statement Pt presents for f/u for cervicalgia and potential occipital neuralgia. She continues to experience sporadic numbness and tingling in her scalp along with increased cervical pain and limitations in cervical motion. She has consistently followed her HEP and despite ongoing PT sessions her deficits have not improved. PT recommends further medical management and pt reports being scheduled for a neurology apt at end of September. She will continue to follow HEP to maintain strength and mobility gains as bridge to further medical care.    Personal Factors and Comorbidities Comorbidity 1    Comorbidities T2DM    Examination-Activity Limitations Bend    Examination-Participation Restrictions Occupation;Other   Cookie Making   Stability/Clinical Decision Making Stable/Uncomplicated    Rehab Potential Good    PT Frequency 2x / week    PT Duration Other (comment)   5   PT Treatment/Interventions Traction;Moist Heat;Electrical Stimulation;Therapeutic activities;Therapeutic exercise;Joint Manipulations;Manual techniques    PT Next Visit Plan N/a discharge    PT Home Exercise Plan [email or web address]    Recommended Other Services Neurologist    Consulted and Agree with Plan of Care Patient            HEP includes following:  Access Code: [email or web address] URL: [email or web address] Date: 03/07/2021 Prepared by: Bradly Chris  Exercises Seated Upper Trapezius Stretch - 1 x daily - 7 x weekly - 1 sets - 5 reps - 30 hold Seated Levator Scapulae Stretch - 1 x daily - 3 x weekly - 1 sets - 5 reps - 30 hold Cervical Extension Stretch - 1 x daily - 7 x weekly - 1 sets - 5 reps - 60 hold Seated Cervical Flexion Stretch with Finger Support Behind Neck - 1 x daily - 7 x weekly - 1 sets - 5 reps -  30 hold Doorway Pec Stretch at 90 Degrees Abduction - 1 x daily - 7 x weekly - 1 sets - 5 reps - 30 hold Sternocleidomastoid Stretch - 1 x daily - 7 x weekly - 1 sets - 5 reps - 60 hold Seated Cervical Retraction - 1 x daily - 3 x weekly - 3 sets - 10 reps Seated Shoulder Horizontal Abduction with Resistance - 1 x daily - 3 x weekly - 3 sets - 10 reps Standing Cervical Rotation with Anchored Resistance - 1 x daily - 7 x weekly - 3 sets - 10 reps Standing Cervical Sidebending with Anchored Resistance - 1 x daily - 7 x weekly - 3 sets - 10 reps Wall Push Up with Plus - 1 x daily - 3 x weekly - 3 sets - 15 reps Shoulder PNF D2 Flexion - 1 x daily - 3 x weekly - 3 sets - 10 reps Seated Shoulder Row with Anchored Resistance - 1 x daily - 3 x weekly - 3 sets - 10 reps   Patient will benefit from skilled therapeutic intervention in order to improve the following deficits and impairments:  Pain, Postural dysfunction, Decreased range of motion, Impaired flexibility  Visit Diagnosis: Cervicalgia     Problem List Patient Active Problem List   Diagnosis Date Noted   Encounter for annual routine gynecological examination 04/16/2017   Attention deficit hyperactivity disorder (ADHD) 09/19/2011   Diabetes mellitus ([place]) 09/16/2011   Hypothyroidism 09/16/2011   Bradly Chris PT, DPT  03/07/2021, 9:06 AM  [place] [facility] [address], [place], [address] Phone: [phone]   Fax:  [phone]  Name: XIANNA SIVERLING MRN: [id number] Date of Birth:  [birth year]

## 2021-03-12 ENCOUNTER — Ambulatory Visit: Payer: Medicaid Other | Admitting: Physical Therapy

## 2021-03-14 ENCOUNTER — Encounter: Payer: Medicaid Other | Admitting: Physical Therapy

## 2021-03-19 ENCOUNTER — Encounter: Payer: Medicaid Other | Admitting: Physical Therapy

## 2021-03-21 ENCOUNTER — Other Ambulatory Visit: Payer: Self-pay

## 2021-03-21 ENCOUNTER — Ambulatory Visit
Admission: RE | Admit: 2021-03-21 | Discharge: 2021-03-21 | Disposition: A | Discharge status: Home / Self Care | Payer: Medicaid Other | Source: Ambulatory Visit | Attending: Physician Assistant | Admitting: Physician Assistant

## 2021-03-21 ENCOUNTER — Encounter: Payer: Medicaid Other | Admitting: Physical Therapy

## 2021-03-21 VITALS — BP 111/76 | HR 75 | Temp 98.2°F | Resp 18

## 2021-03-21 DIAGNOSIS — H60392 Other infective otitis externa, left ear: Secondary | ICD-10-CM | POA: Diagnosis not present

## 2021-03-21 MED ORDER — OFLOXACIN 0.3 % OT SOLN: 5.0000 [drp] | Freq: Two times a day (BID) | OTIC | 0 refills | Status: AC

## 2021-03-21 NOTE — ED Triage Notes (Signed)
Ear pain for 3 days. Patient reports that her pcp cleared wax from left ear, it hurt really bad.  When she arrived home, ear was bleeding and has to hurt and be stuff.  Patient does have stuffy nose, sinus pressure.  Patient says she spoke with pcp office and was told to but peroxide in left ear.  Left ear continues to drain blood

## 2021-03-21 NOTE — ED Provider Notes (Signed)
[facility]    CSN: [id number] Arrival date & time: 03/21/21  1805      History   Chief Complaint Chief Complaint  Patient presents with   Appointment    6:00   Otalgia    HPI Melissa Terrell ([place]) is a 35 y.o. female.   Patient presents today with a 3-day history of worsening left otalgia.  She reports pain is currently rated 3 but will increase to an 8 whenever she blows her nose or sneezes, described as intense aching with periodic sharp pains, no alleviating factors identified.  She has tried Tylenol without improvement of symptoms.  Reports she was seen by her PCP who noted cerumen impaction and irrigated this.  She had sharp pain during the time of irrigation which has progressively worsened.  She does report otorrhea which she describes as thick brown/black as that is dried blood or clots.  She denies any fever.  She is having difficulty hearing out of the ear.  She does not use anything in her ear including Q-tips, earbuds, earplugs.  She has been treated with antibiotics recently (doxycycline approximately 2 months ago).  She is diabetic.   Past Medical History:  Diagnosis Date   Diabetes mellitus without complication ([place])    Encounter for insertion of mirena IUD 2013   Thyroid disease     Patient Active Problem List   Diagnosis Date Noted   Encounter for annual routine gynecological examination 04/16/2017   Attention deficit hyperactivity disorder (ADHD) 09/19/2011   Diabetes mellitus ([place]) 09/16/2011   Hypothyroidism 09/16/2011    Past Surgical History:  Procedure Laterality Date   NO PAST SURGERIES      OB History     Gravida  1   Para  1   Term      Preterm  1   AB      Living  1      SAB      IAB      Ectopic      Multiple      Live Births  1            Home Medications    Prior to Admission medications   Medication Sig Start Date End Date Taking? Authorizing Provider  ofloxacin (FLOXIN) 0.3 % OTIC solution Place 5  drops into the left ear 2 (two) times daily. 03/21/21  Yes Cohen Boettner, Derry Skill, PA-C  Blood Glucose Monitoring Suppl (FIFTY50 GLUCOSE METER 2.0) w/Device KIT Use as instructed diabetes 250.00 04/05/14   [provider]  cyclobenzaprine (FLEXERIL) 5 MG tablet Take 1 tablet (5 mg total) by mouth 3 (three) times daily as needed. Patient not taking: Reported on 03/21/2021 01/13/21   Blake Divine, MD  doxycycline (VIBRAMYCIN) 100 MG capsule Take 1 capsule (100 mg total) by mouth 2 (two) times daily. 01/03/21   Sharion Balloon, NP  esomeprazole (NEXIUM) 20 MG capsule Take 20 mg by mouth daily. 12/11/20 12/11/21  [provider]  gabapentin (NEURONTIN) 300 MG capsule Take 1 capsule (300 mg total) by mouth 3 (three) times daily. 04/05/20   Margarette Canada, NP  insulin aspart (NOVOLOG) 100 UNIT/ML FlexPen Inject into the skin. 03/31/17 04/05/20  [provider]  Insulin Glargine (LANTUS SOLOSTAR) 100 UNIT/ML Solostar Pen Inject into the skin. 03/31/17 04/05/20  [provider]  Insulin Pen Needle 31G X 8 MM MISC Use for insulin pen 4 times daily E11.65 07/19/14   [provider]  levonorgestrel (MIRENA) 20 MCG/24HR  IUD Frequency:UNKNOWN   Dosage:0.0     Instructions:  Note:Dose: 1 10/15/11   [provider]  levothyroxine (SYNTHROID, LEVOTHROID) 50 MCG tablet Take 50 mcg by mouth daily before breakfast.    [provider]  lidocaine (LIDODERM) 5 % Place 1 patch onto the skin every 12 (twelve) hours. Remove & Discard patch within 12 hours or as directed by MD 01/13/21 01/13/22  Blake Divine, MD  metFORMIN (GLUCOPHAGE) 500 MG tablet Take 500 mg by mouth. 03/31/17 04/05/20  [provider]  metFORMIN (GLUCOPHAGE) 500 MG tablet Take 500 mg by mouth daily. 10/13/20   [provider]  methylPREDNISolone (MEDROL DOSEPAK) 4 MG TBPK tablet Take according to package insert Patient not taking: Reported on 03/21/2021 04/05/20   Margarette Canada, NP  albuterol  (PROVENTIL HFA;VENTOLIN HFA) 108 (90 Base) MCG/ACT inhaler Inhale 1-2 puffs into the lungs every 6 (six) hours as needed for wheezing or shortness of breath. Use with spacer 07/21/18 04/05/20  Lorin Picket, PA-C    Family History Family History  Problem Relation Age of Onset   Ovarian cancer Other    Diabetes Mother    Heart disease Father     Social History Social History   Tobacco Use   Smoking status: Former    Packs/day: 0.50    Types: Cigarettes   Smokeless tobacco: Never  Vaping Use   Vaping Use: Former   Substances: Nicotine  Substance Use Topics   Alcohol use: No   Drug use: No     Allergies   Penicillins   Review of Systems Review of Systems  Constitutional:  Negative for activity change, appetite change, fatigue and fever.  HENT:  Positive for congestion, ear discharge, ear pain and hearing loss. Negative for sinus pressure, sneezing and sore throat.   Respiratory:  Negative for cough and shortness of breath.   Cardiovascular:  Negative for chest pain.  Gastrointestinal:  Negative for abdominal pain, diarrhea, nausea and vomiting.  Neurological:  Negative for dizziness, light-headedness and headaches.    Physical Exam Triage Vital Signs ED Triage Vitals  Enc Vitals Group     BP 03/21/21 1816 111/76     Pulse Rate 03/21/21 1816 75     Resp 03/21/21 1816 18     Temp 03/21/21 1816 98.2 F (36.8 C)     Temp Source 03/21/21 1816 Oral     SpO2 03/21/21 1816 98 %     Weight --      Height --      Head Circumference --      Peak Flow --      Pain Score 03/21/21 1818 3     Pain Loc --      Pain Edu? --      Excl. in [place]? --    No data found.  Updated Vital Signs BP 111/76 (BP Location: Left Arm)   Pulse 75   Temp 98.2 F (36.8 C) (Oral)   Resp 18   SpO2 98%   Visual Acuity Right Eye Distance:   Left Eye Distance:   Bilateral Distance:    Right Eye Near:   Left Eye Near:    Bilateral Near:     Physical Exam Vitals reviewed.   Constitutional:      General: She is awake. She is not in acute distress.    Appearance: Normal appearance. She is well-developed. She is not ill-appearing.     Comments: Very pleasant female appears stated age in no acute  distress sitting comfortably in exam room  HENT:     Head: Normocephalic and atraumatic.     Right Ear: Tympanic membrane, ear canal and external ear normal. Tympanic membrane is not erythematous or bulging.     Left Ear: Tympanic membrane, ear canal and external ear normal. Drainage, swelling and tenderness present. Tympanic membrane is not erythematous or bulging.     Ears:     Comments: Mild tenderness to palpation of tragus pain with manipulation of external ear.  Erythema and edema of external auditory canal noted; able to Terrell approximately 30% of TM that appears normal.    Nose:     Right Sinus: Maxillary sinus tenderness present. No frontal sinus tenderness.     Left Sinus: Maxillary sinus tenderness and frontal sinus tenderness present.     Mouth/Throat:     Pharynx: Uvula midline. No oropharyngeal exudate or posterior oropharyngeal erythema.  Cardiovascular:     Rate and Rhythm: Normal rate and regular rhythm.     Heart sounds: Normal heart sounds, S1 normal and S2 normal. No murmur heard. Pulmonary:     Effort: Pulmonary effort is normal.     Breath sounds: Normal breath sounds. No wheezing, rhonchi or rales.     Comments: Clear to auscultation bilaterally Psychiatric:        Behavior: Behavior is cooperative.     UC Treatments / Results  Labs (all labs ordered are listed, but only abnormal results are displayed) Labs Reviewed - No data to display  EKG   Radiology No results found.  Procedures Procedures (including critical care time)  Medications Ordered in UC Medications - No data to display  Initial Impression / Assessment and Plan / UC Course  I have reviewed the triage vital signs and the nursing notes.  Pertinent labs & imaging  results that were available during my care of the patient were reviewed by me and considered in my medical decision making (Terrell chart for details).      Patient treated for otitis externa.  She was prescribed ofloxacin to cover for Pseudomonas given history of diabetes.  Will defer oral antibiotics for the time being.  She can use Tylenol for pain.  Discussed that she should keep ear facing upward for 5 to 10 minutes after applying drops to ensure complete penetration throughout ear canal.  Discussed that if symptoms or not improving she should follow-up with ENT.  Discussed alarm symptoms that warrant emergent evaluation.  Recommend she avoid submerging her head in water or placing anything in the ear until symptoms resolve.  Strict return precautions given to which she expressed understanding.  Final Clinical Impressions(s) / UC Diagnoses   Final diagnoses:  Infective otitis externa of left ear     Discharge Instructions      We are treating you for an external ear infection.  Please use drops twice daily for 10 days.  Keep your ear facing upward for several minutes after using the drops to ensure it goes all the way through your canal.  You can use Tylenol for pain relief.  Avoid submerging your head underwater.  Do not put anything in your ears until symptoms improve.  If your symptoms or not improving please follow-up with ear nose and throat doctor as we discussed.     ED Prescriptions     Medication Sig Dispense Auth. Provider   ofloxacin (FLOXIN) 0.3 % OTIC solution Place 5 drops into the left ear 2 (two) times daily. 5 mL Raspet,  Derry Skill, PA-C      PDMP not reviewed this encounter.   Terrilee Croak, PA-C 03/21/21 1837

## 2021-03-21 NOTE — Discharge Instructions (Addendum)
We are treating you for an external ear infection.  Please use drops twice daily for 10 days.  Keep your ear facing upward for several minutes after using the drops to ensure it goes all the way through your canal.  You can use Tylenol for pain relief.  Avoid submerging your head underwater.  Do not put anything in your ears until symptoms improve.  If your symptoms or not improving please follow-up with ear nose and throat doctor as we discussed.

## 2021-03-29 ENCOUNTER — Other Ambulatory Visit: Payer: Self-pay | Admitting: Neurology

## 2021-03-30 ENCOUNTER — Other Ambulatory Visit: Payer: Self-pay | Admitting: Neurology

## 2021-03-30 ENCOUNTER — Other Ambulatory Visit (HOSPITAL_COMMUNITY): Payer: Self-pay | Admitting: Neurology

## 2021-03-30 DIAGNOSIS — G35 Multiple sclerosis: Secondary | ICD-10-CM

## 2021-03-30 DIAGNOSIS — R42 Dizziness and giddiness: Secondary | ICD-10-CM

## 2021-03-30 DIAGNOSIS — G44219 Episodic tension-type headache, not intractable: Secondary | ICD-10-CM

## 2021-04-11 ENCOUNTER — Other Ambulatory Visit: Payer: Self-pay

## 2021-04-11 ENCOUNTER — Ambulatory Visit
Admission: RE | Admit: 2021-04-11 | Discharge: 2021-04-11 | Disposition: A | Discharge status: Home / Self Care | Payer: Medicaid Other | Source: Ambulatory Visit | Attending: Neurology | Admitting: Neurology

## 2021-04-11 DIAGNOSIS — R42 Dizziness and giddiness: Secondary | ICD-10-CM | POA: Insufficient documentation

## 2021-04-11 DIAGNOSIS — G35 Multiple sclerosis: Secondary | ICD-10-CM | POA: Diagnosis not present

## 2021-04-11 DIAGNOSIS — G44219 Episodic tension-type headache, not intractable: Secondary | ICD-10-CM | POA: Insufficient documentation

## 2021-04-11 IMAGING — MR MR HEAD W/O CM
12 series · 42 of 48 positions shown · non-contrast
Comparison: Maxillofacial CT [DATE].

CLINICAL DATA: MS (multiple sclerosis). Dizziness. Episodic
tension-type headache, not intractable. Additional history provided
by scanning technologist: Patient reports dizziness for 3 months.
Patient also reports an episode of "whole body tingling" 1 month
ago.

EXAM:
MRI HEAD WITHOUT CONTRAST
TECHNIQUE: Multiplanar, multiecho pulse sequences of the brain and surrounding
structures were obtained without intravenous contrast.

[Series 5: ax dwi_tracew · axial · 3.0mm · 0.60mm/px · z∈[-103,+49]mm · 4 of 48 slices shown]
[im 1/48]
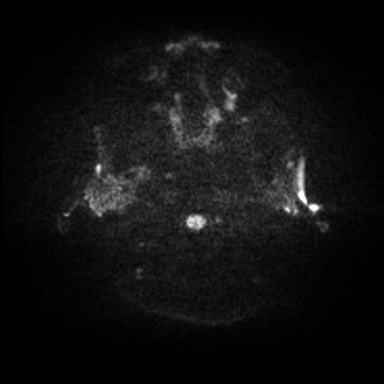
[im 16/48]
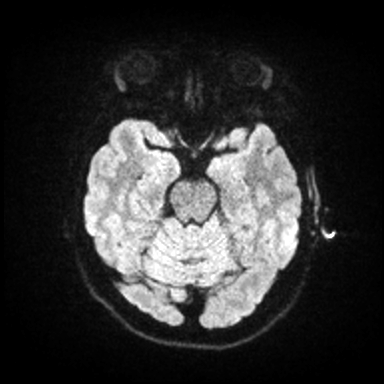
[im 32/48]
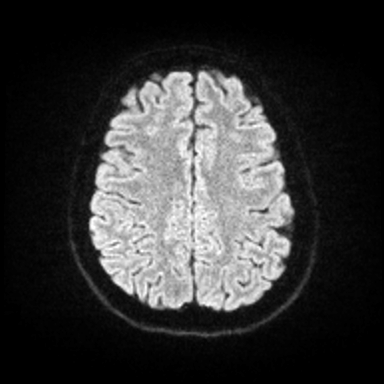
[im 48/48]
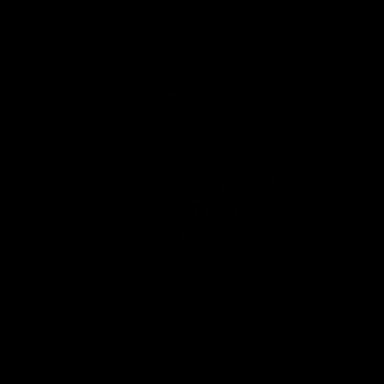

[Series 6: ax dwi_adc · axial · 3.0mm · 0.60mm/px · z∈[-103,+43]mm · 4 of 46 slices shown]
[im 1/46]
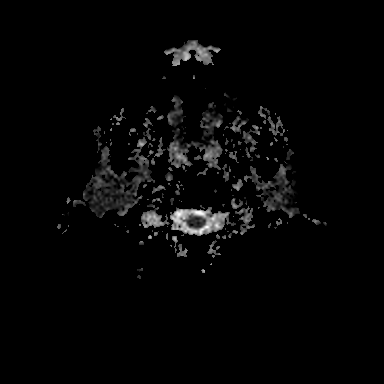
[im 16/46]
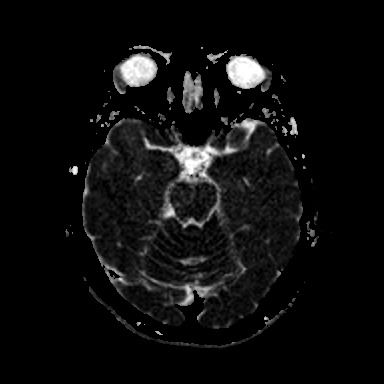
[im 31/46]
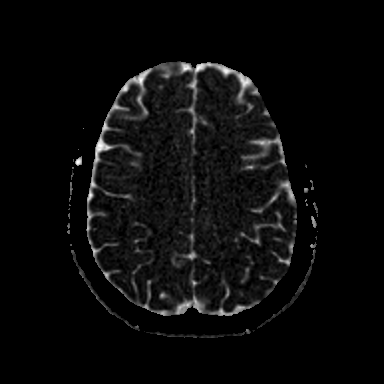
[im 46/46]
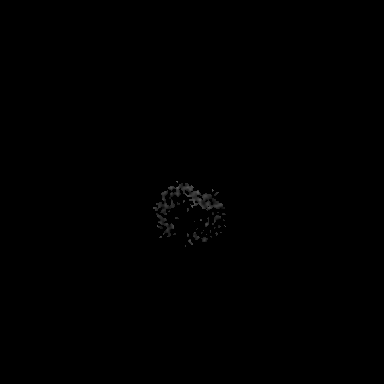

[Series 7: cor dwi_tracew · coronal · 5.0mm · 0.60mm/px · 3 of 40 slices shown]
[im 1/40]
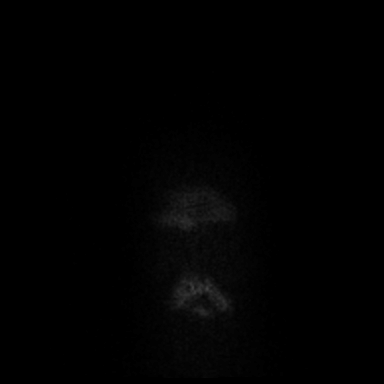
[im 20/40]
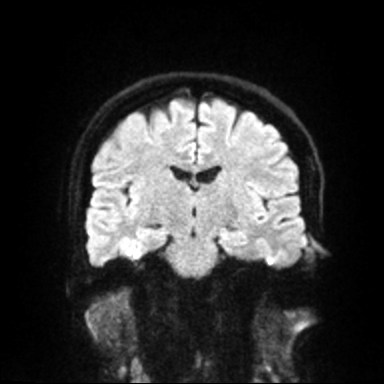
[im 40/40]
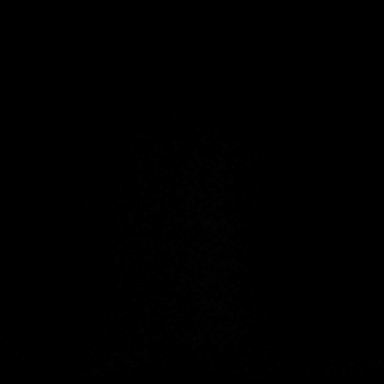

[Series 8: cor dwi_adc · coronal · 5.0mm · 0.60mm/px · 3 of 39 slices shown]
[im 1/39]
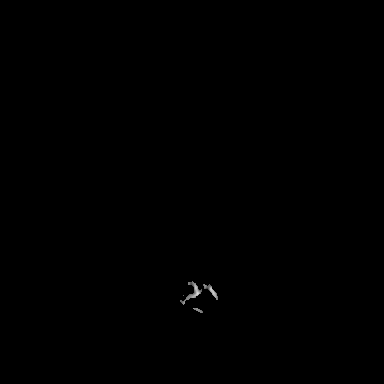
[im 20/39]
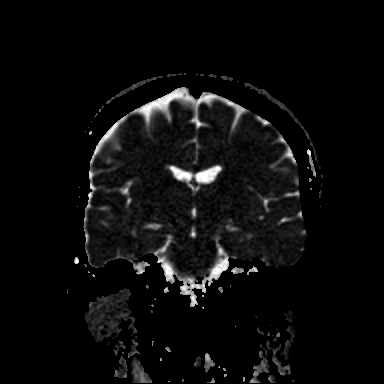
[im 39/39]
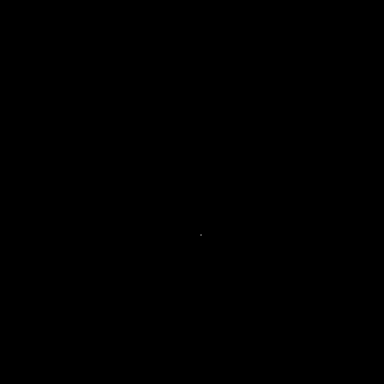

[Series 9: T1 · sagittal · 5.0mm · 0.62mm/px · 2 of 25 slices shown (1 of 2)]
[im 1/25]
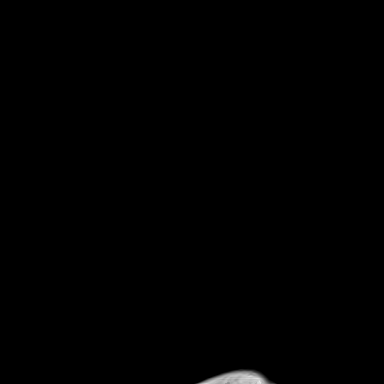
[im 25/25]
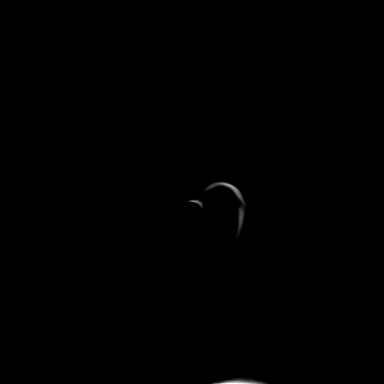

[Series 10: FLAIR · sagittal · 5.0mm · 0.94mm/px · 2 of 25 slices shown (1 of 2)]
[im 1/25]
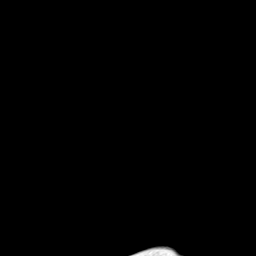
[im 25/25]
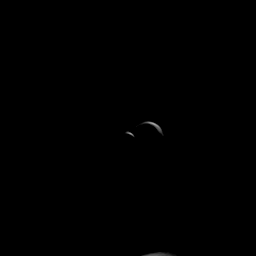

[Series 11: T2 · axial · 5.0mm · 0.53mm/px · z∈[-100,+48]mm · 2 of 26 slices shown (1 of 2)]
[im 1/26]
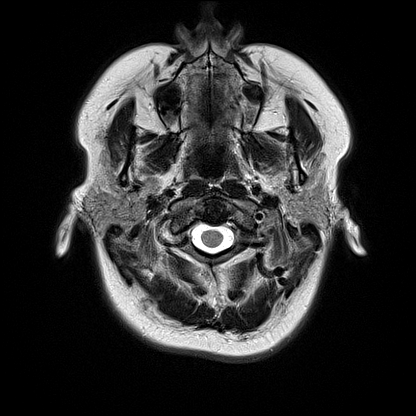
[im 26/26]
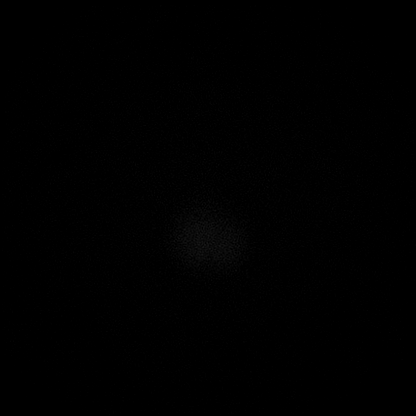

[Series 13: pha_images · axial · 3.0mm · 0.90mm/px · z∈[-114,+60]mm · 4 of 57 slices shown]
[im 1/57]
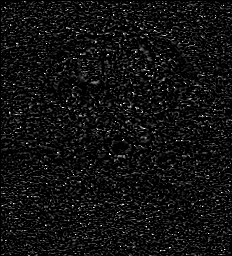
[im 19/57]
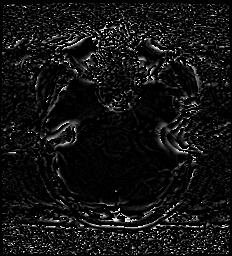
[im 38/57]
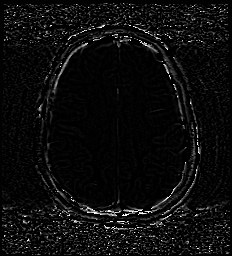
[im 57/57]
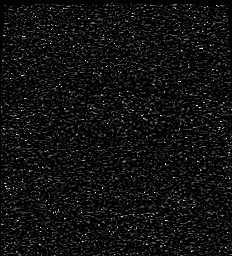

[Series 14: swi_images · axial · 3.0mm · 0.90mm/px · z∈[-114,+16]mm · 4 of 60 slices shown]
[im 1/60]
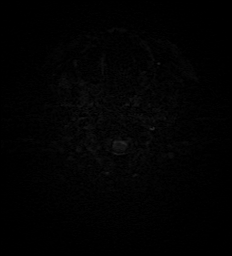
[im 15/60]
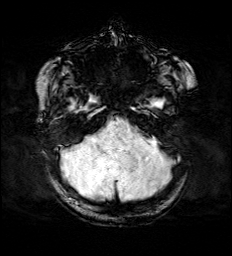
[im 30/60]
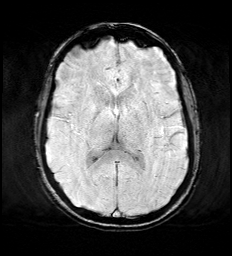
[im 45/60]
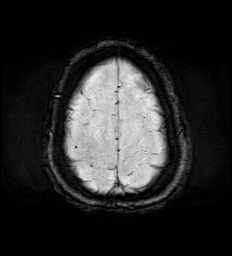

[Series 16: FLAIR · axial · 3.0mm · 0.53mm/px · z∈[-106,+54]mm · 4 of 55 slices shown (2 of 2)]
[im 1/55]
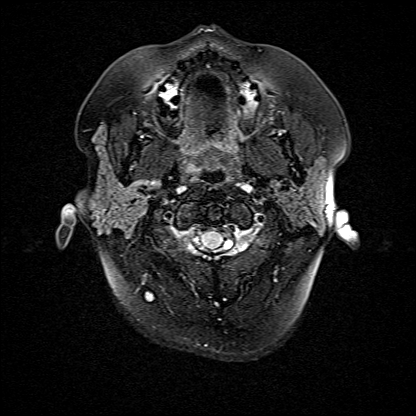
[im 19/55]
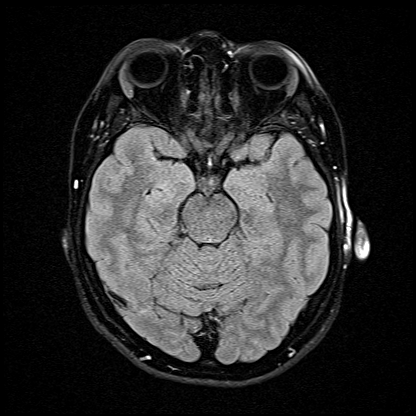
[im 37/55]
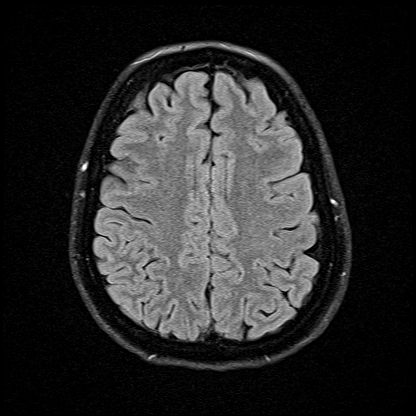
[im 55/55]
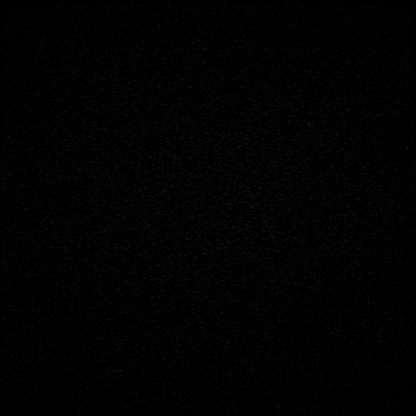

[Series 17: T1 · axial · 1.0mm · 0.98mm/px · z∈[-115,+56]mm · 8 of 174 slices shown (2 of 2)]
[im 1/174]
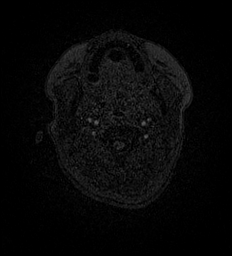
[im 29/174]
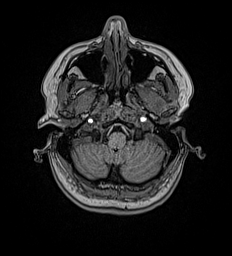
[im 58/174]
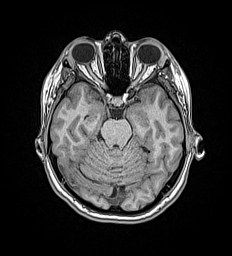
[im 73/174]
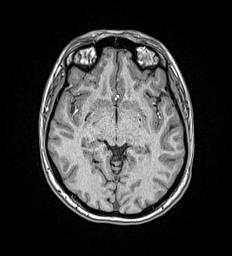
[im 101/174]
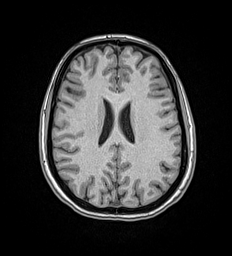
[im 116/174]
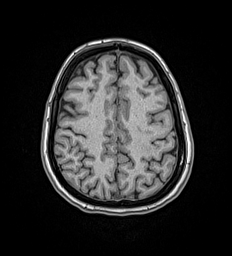
[im 145/174]
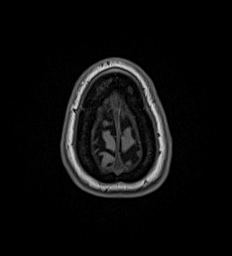
[im 174/174]
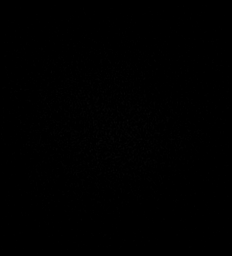

[Series 18: T2 · coronal · 5.0mm · 0.57mm/px · 2 of 29 slices shown (2 of 2)]
[im 1/29]
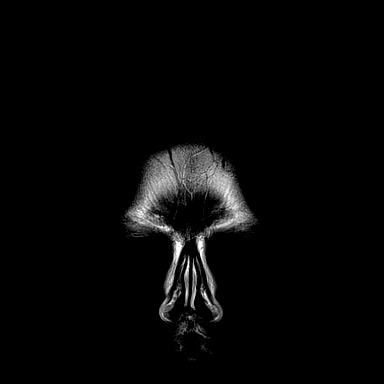
[im 29/29]
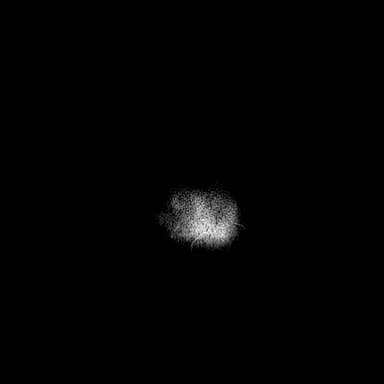

[42 of 48 positions shown; findings below may reference images not displayed]

FINDINGS: Brain:

Cerebral volume is normal.

There are a few small scattered nonspecific foci of T2 FLAIR
hyperintense signal abnormality within the bilateral cerebral white
matter, measuring up to 3 mm.

No cortical encephalomalacia is identified.

There is no acute infarct.

No evidence of an intracranial mass.

No chronic intracranial blood products.

No extra-axial fluid collection.

No midline shift.

Vascular: Maintained flow voids within the proximal large arterial
vessels.

Skull and upper cervical spine: Focal suspicious marrow lesion

Sinuses/Orbits: Visualized orbits show no acute finding. No
significant paranasal sinus disease.

Other: Trace fluid within the right mastoid air cells.
IMPRESSION: No evidence of acute intracranial abnormality.

There are a few small scattered foci of T2 FLAIR hyperintense signal
abnormality within the bilateral cerebral white matter, measuring up
to 3 mm. These signal changes are nonspecific and differential
considerations include age-advanced chronic small vessel ischemic
disease, sequela of chronic migraine headaches, sequela of a prior
infectious/inflammatory process or sequela of a demyelinating
process (such as multiple sclerosis, among others.

Otherwise unremarkable non-contrast MRI appearance of the brain.

Trace right mastoid effusion.

## 2021-05-01 DIAGNOSIS — E109 Type 1 diabetes mellitus without complications: Secondary | ICD-10-CM | POA: Insufficient documentation

## 2021-05-15 ENCOUNTER — Other Ambulatory Visit: Payer: Self-pay | Admitting: Neurology

## 2021-05-15 DIAGNOSIS — R29898 Other symptoms and signs involving the musculoskeletal system: Secondary | ICD-10-CM

## 2021-05-15 DIAGNOSIS — M542 Cervicalgia: Secondary | ICD-10-CM

## 2021-05-15 DIAGNOSIS — S199XXS Unspecified injury of neck, sequela: Secondary | ICD-10-CM

## 2021-05-15 DIAGNOSIS — R200 Anesthesia of skin: Secondary | ICD-10-CM

## 2021-05-28 ENCOUNTER — Ambulatory Visit
Admission: RE | Admit: 2021-05-28 | Discharge: 2021-05-28 | Disposition: A | Discharge status: Home / Self Care | Payer: Medicaid Other | Source: Ambulatory Visit | Attending: Neurology | Admitting: Neurology

## 2021-05-28 ENCOUNTER — Other Ambulatory Visit: Payer: Self-pay

## 2021-05-28 DIAGNOSIS — R202 Paresthesia of skin: Secondary | ICD-10-CM | POA: Diagnosis present

## 2021-05-28 DIAGNOSIS — M542 Cervicalgia: Secondary | ICD-10-CM | POA: Insufficient documentation

## 2021-05-28 DIAGNOSIS — R200 Anesthesia of skin: Secondary | ICD-10-CM | POA: Diagnosis present

## 2021-05-28 DIAGNOSIS — R29898 Other symptoms and signs involving the musculoskeletal system: Secondary | ICD-10-CM | POA: Insufficient documentation

## 2021-05-28 DIAGNOSIS — S199XXS Unspecified injury of neck, sequela: Secondary | ICD-10-CM | POA: Insufficient documentation

## 2021-05-28 IMAGING — MR MR CERVICAL SPINE W/O CM
5 series · 38 of 48 positions shown · non-contrast
Comparison: Brain MRI [DATE].

CLINICAL DATA: Cervicalgia. Neck injury, sequela. Numbness and
tingling in left arm. Left arm weakness. Additional history provided
by scanning technologist: Patient reports possible injury in work
chair at home 4 months ago; tension/sensation of squeezing in head,
sometimes painful to touch at back of skull, occasional dizziness.

EXAM:
MRI CERVICAL SPINE WITHOUT CONTRAST
TECHNIQUE: Multiplanar, multisequence MR imaging of the cervical spine was
performed. No intravenous contrast was administered.

[Series 5: T2 · sagittal · 3.0mm · 0.62mm/px · 6 of 15 slices shown (1 of 2)]
[im 1/15]
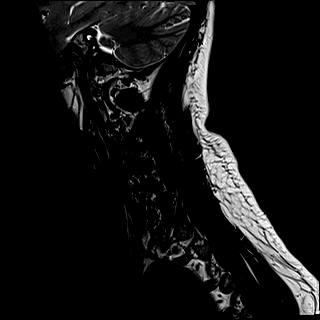
[im 3/15]
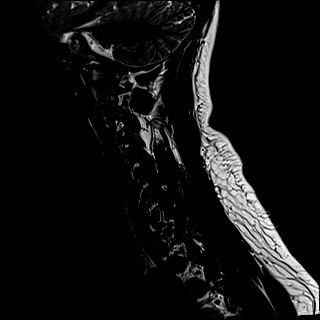
[im 6/15]
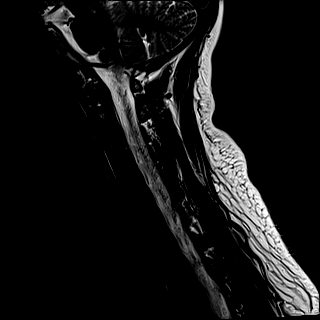
[im 9/15]
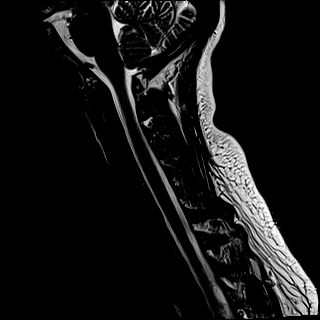
[im 12/15]
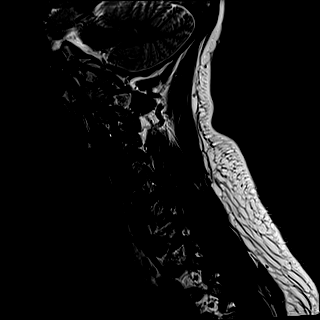
[im 15/15]
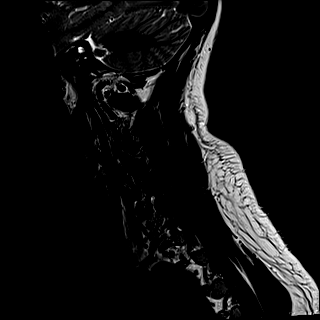

[Series 6: FLAIR · sagittal · 3.0mm · 0.78mm/px · 7 of 15 slices shown]
[im 1/15]
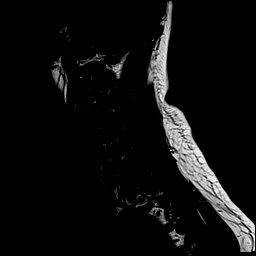
[im 3/15]
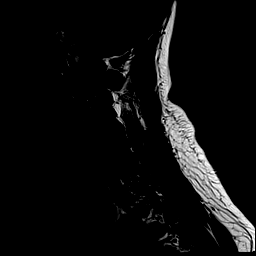
[im 5/15]
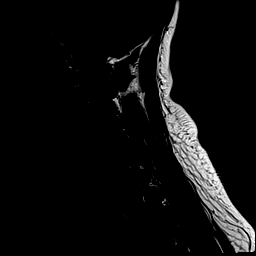
[im 8/15]
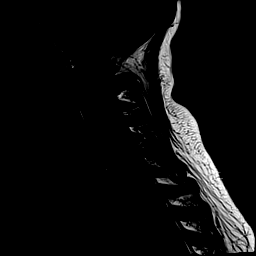
[im 10/15]
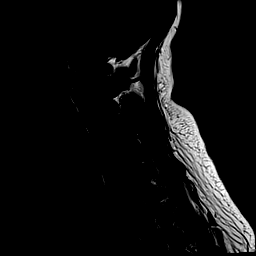
[im 12/15]
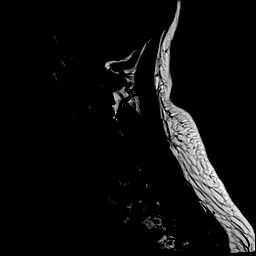
[im 15/15]
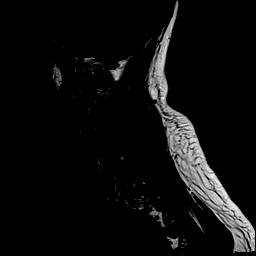

[Series 7: STIR · sagittal · 3.0mm · 0.62mm/px · 7 of 15 slices shown]
[im 1/15]
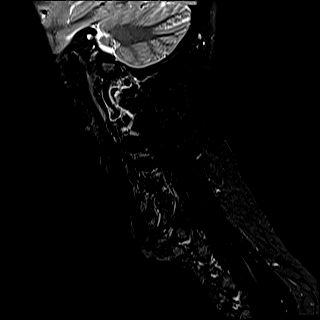
[im 3/15]
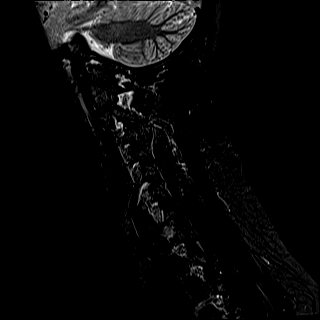
[im 5/15]
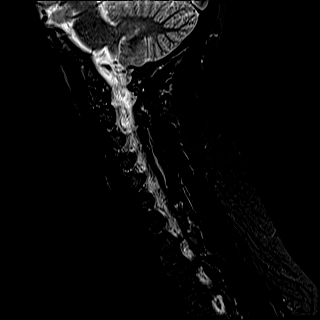
[im 8/15]
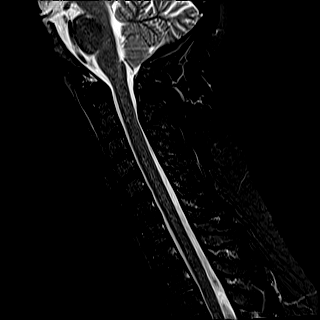
[im 10/15]
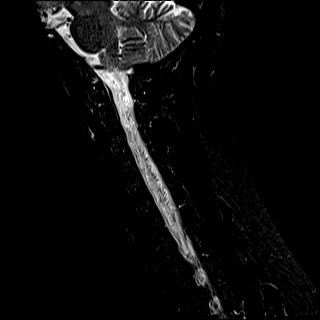
[im 12/15]
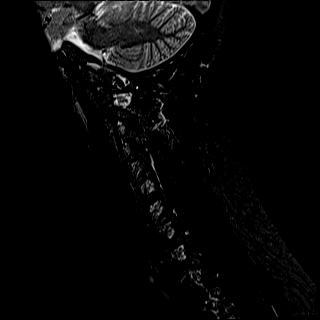
[im 15/15]
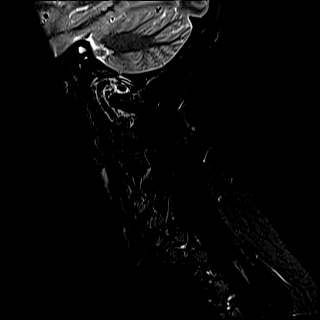

[Series 8: T2 · axial · 3.0mm · 0.70mm/px · z∈[-102,-11]mm · 10 of 29 slices shown (2 of 2)]
[im 1/29]
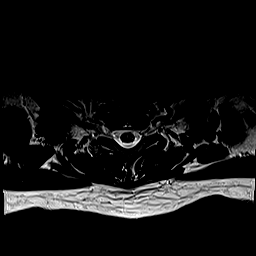
[im 3/29]
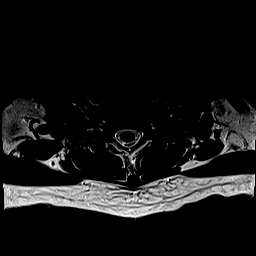
[im 5/29]
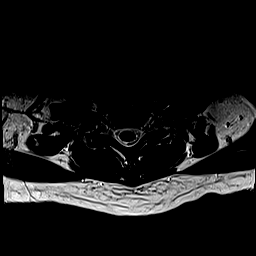
[im 7/29]
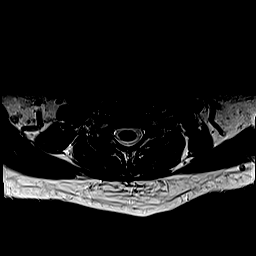
[im 9/29]
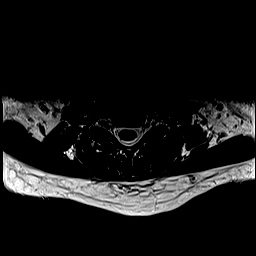
[im 13/29]
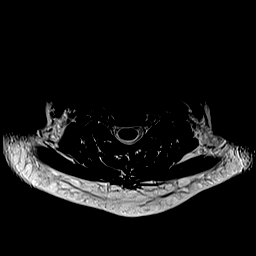
[im 16/29]
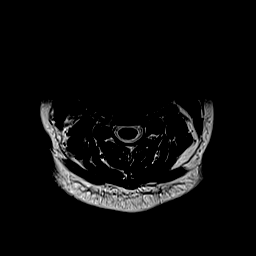
[im 20/29]
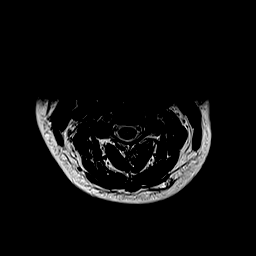
[im 24/29]
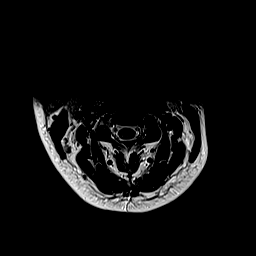
[im 29/29]
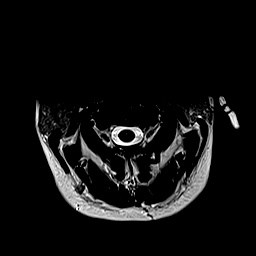

[Series 9: ax mpgr · axial · 3.0mm · 0.35mm/px · z∈[-102,-11]mm · 8 of 29 slices shown]
[im 1/29]
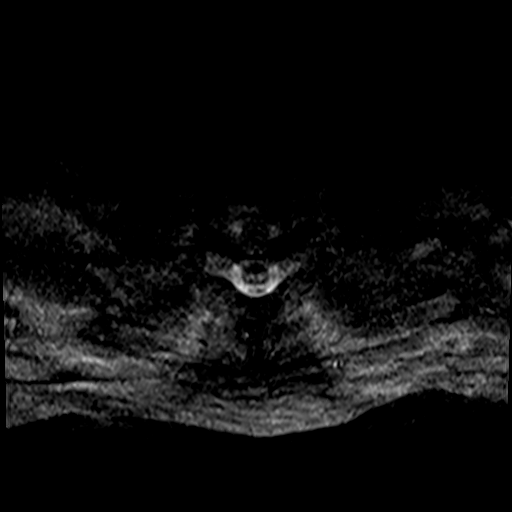
[im 5/29]
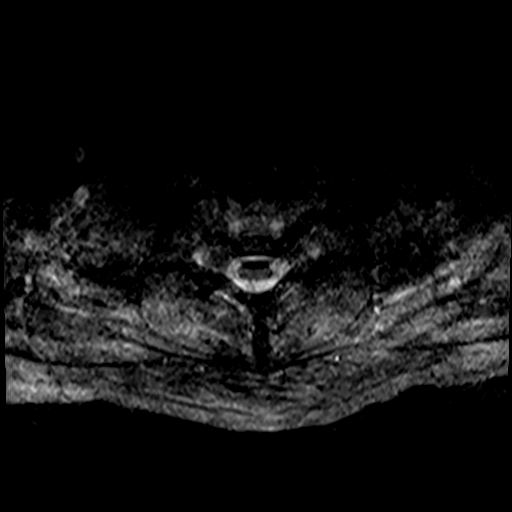
[im 9/29]
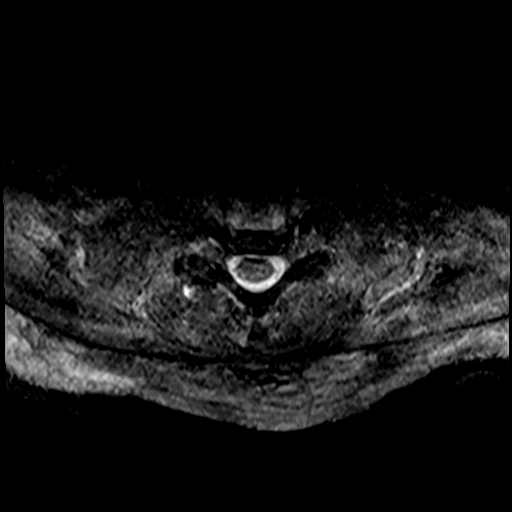
[im 13/29]
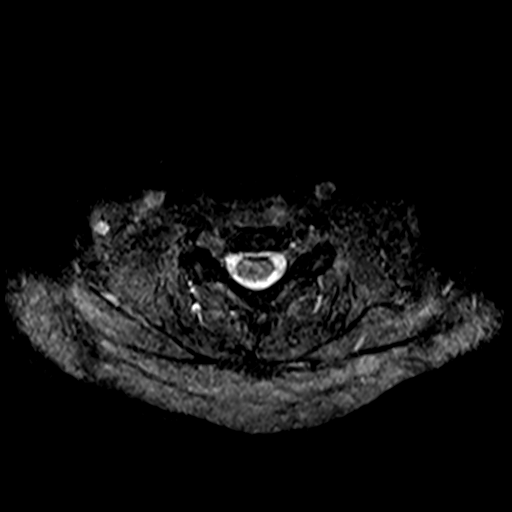
[im 16/29]
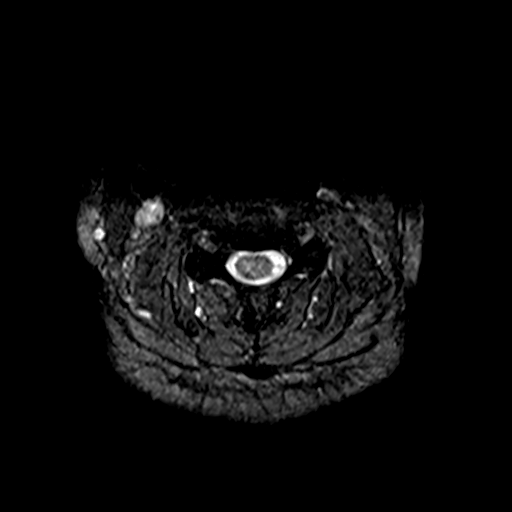
[im 20/29]
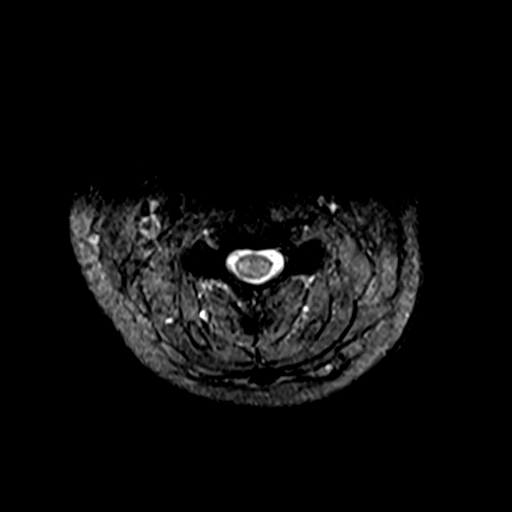
[im 24/29]
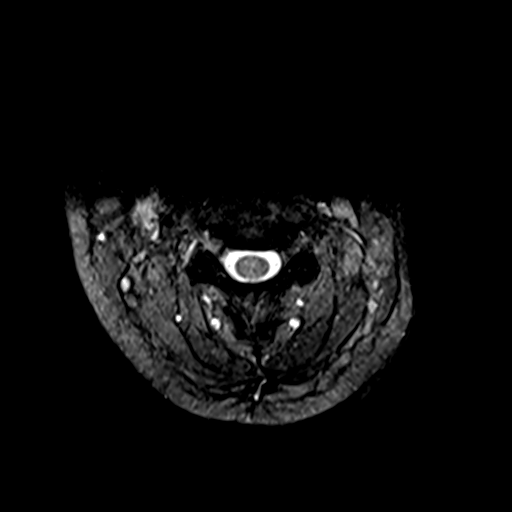
[im 29/29]
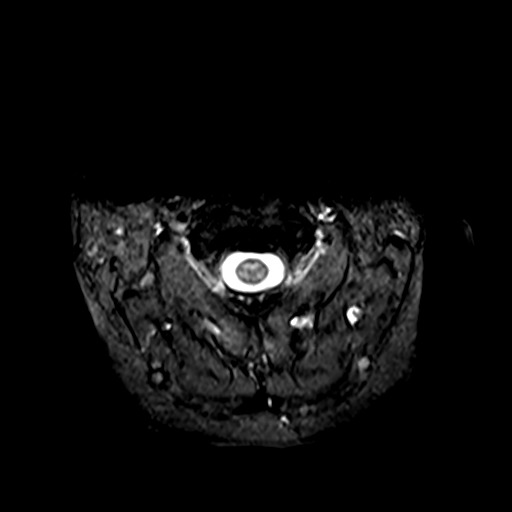

[38 of 48 positions shown; findings below may reference images not displayed]

FINDINGS: Alignment: Straightening of the expected cervical lordosis. No
significant spondylolisthesis.

Vertebrae: Vertebral body height is maintained. No significant
marrow edema or focal suspicious osseous lesion.

Cord: No signal abnormality within the cervical spinal cord.

Posterior Fossa, vertebral arteries, paraspinal tissues: No
abnormality identified within included portions of the posterior
fossa. Flow voids preserved within the imaged cervical vertebral
arteries. Paraspinal soft tissues unremarkable.

Disc levels:

No more than mild disc degeneration within the cervical spine.

C2-C3: No significant disc herniation or stenosis.

C3-C4: No significant disc herniation or stenosis.

C4-C5: No significant disc herniation or stenosis.

C5-C6: Slight disc bulge. Minimal facet arthrosis on the left. No
significant spinal canal or foraminal stenosis.

C6-C7: Slight disc bulge. No significant spinal canal or foraminal
stenosis.

C7-T1: No significant disc herniation or stenosis.
IMPRESSION: Cervical spondylosis, as outlined and with findings most notably as
follows.

At C5-C6, there is a slight disc bulge. Minimal facet arthrosis on
the left. No significant spinal canal or foraminal stenosis.

At C6-C7, there is a slight disc bulge. No significant spinal canal
or foraminal stenosis.

No more than mild disc degeneration within the cervical spine.

Nonspecific straightening of the expected cervical lordosis.

## 2021-08-28 DIAGNOSIS — F418 Other specified anxiety disorders: Secondary | ICD-10-CM | POA: Insufficient documentation

## 2022-05-06 ENCOUNTER — Encounter: Payer: Self-pay | Admitting: Family Medicine

## 2022-05-06 ENCOUNTER — Other Ambulatory Visit (HOSPITAL_COMMUNITY)
Admission: RE | Admit: 2022-05-06 | Discharge: 2022-05-06 | Disposition: A | Discharge status: Home / Self Care | Payer: Medicaid Other | Source: Ambulatory Visit | Attending: Family Medicine | Admitting: Family Medicine

## 2022-05-06 ENCOUNTER — Ambulatory Visit: Payer: Medicaid Other | Admitting: Family Medicine

## 2022-05-06 VITALS — BP 123/85 | HR 92 | Ht 63.0 in | Wt 209.0 lb

## 2022-05-06 DIAGNOSIS — Z01419 Encounter for gynecological examination (general) (routine) without abnormal findings: Secondary | ICD-10-CM | POA: Diagnosis present

## 2022-05-06 DIAGNOSIS — Z794 Long term (current) use of insulin: Secondary | ICD-10-CM

## 2022-05-06 DIAGNOSIS — Z975 Presence of (intrauterine) contraceptive device: Secondary | ICD-10-CM

## 2022-05-06 DIAGNOSIS — E119 Type 2 diabetes mellitus without complications: Secondary | ICD-10-CM | POA: Diagnosis not present

## 2022-05-06 DIAGNOSIS — Z3169 Encounter for other general counseling and advice on procreation: Secondary | ICD-10-CM

## 2022-05-06 NOTE — Progress Notes (Unsigned)
GYNECOLOGY ANNUAL PREVENTATIVE CARE ENCOUNTER NOTE  Subjective:   Melissa Terrell is a 36 y.o. G16P0101 female here for a routine annual gynecologic exam.  Current complaints: Here for annual and to talk about future pregnancy.    OB hx s/f PPROM with delivery at 33 weeks after a fall.  Infant was 6# at that time. No other complications in pregnancy. Patient was 89 when she was last pregnant. Reports she had IUD induced amenorrhea but recently started having a monthly episode of spotting that coincides with her daughter's menses.   Denies abnormal vaginal bleeding, discharge, pelvic pain, problems with intercourse or other gynecologic concerns.    Gynecologic History No LMP recorded. (Menstrual status: IUD). Contraception: IUD Last Pap: 2018. Results were: normal Last mammogram: NA.   Health Maintenance Due  Topic Date Due   HEMOGLOBIN A1C  Never done   COVID-19 Vaccine (1) Never done   FOOT EXAM  Never done   OPHTHALMOLOGY EXAM  Never done   HIV Screening  Never done   Hepatitis C Screening  Never done   PAP SMEAR-Modifier  04/16/2020   Diabetic kidney evaluation - GFR measurement  01/12/2022   Diabetic kidney evaluation - Urine ACR  01/19/2022   INFLUENZA VACCINE  01/22/2022    The following portions of the patient's history were reviewed and updated as appropriate: allergies, current medications, past family history, past medical history, past social history, past surgical history and problem list.  Review of Systems Pertinent items are noted in HPI.   Objective:  BP 123/85   Pulse 92   Ht 5\' 3"  (1.6 m)   Wt 209 lb (94.8 kg)   BMI 37.02 kg/m  CONSTITUTIONAL: Well-developed, well-nourished female in no acute distress.  HENT:  Normocephalic, atraumatic. Oropharynx is clear and moist EYES:  No scleral icterus.  NECK: Normal range of motion, supple, no masses.  Normal thyroid.  SKIN: Skin is warm and dry. No rash noted. Not diaphoretic. No erythema. No  pallor. NEUROLOGIC: Alert and oriented to person, place, and time. Normal reflexes, muscle tone coordination. No cranial nerve deficit noted. PSYCHIATRIC: Normal mood and affect. Normal behavior. Normal judgment and thought content. CARDIOVASCULAR: Normal heart rate noted, regular rhythm. 2+ distal pulses. RESPIRATORY: Effort and breath sounds normal, no problems with respiration noted. BREASTS: Symmetric in size. No masses, skin changes, nipple drainage, or lymphadenopathy. ABDOMEN: Soft,  no distention noted.  No tenderness, rebound or guarding.  PELVIC: Normal appearing external genitalia; normal appearing vaginal mucosa and cervix.  No abnormal discharge noted.  Pap smear obtained.  Normal uterine size, no other palpable masses, no uterine or adnexal tenderness. Has multipel  MUSCULOSKELETAL: Normal range of motion.    Assessment and Plan:  1) Annual gynecologic examination with pap smear:  Will follow up results of pap smear and manage accordingly.   Routine preventative health maintenance measures emphasized.  2) Contraception counseling: has IUS  1. Well woman exam with routine gynecological exam - Cytology - PAP  2. Type 2 diabetes mellitus without complication, without long-term current use of insulin (HCC) ) Patient reports last HA1C was > 8%-- In CareEverywhere was 10.1% in July 2023. Patient's mother is a T1DM and they think that August 2023 is a T1.5DM as she has become insulin dependent, additionally she has other autoimmune conditions which would support this finding.   Discussed in detail the risks associated with uncontrolled diabetes. Reviewed increased risk of miscarriage, fetal anomalies, macrosomia, early delivery, HTN and PEC. Reviewed the tighter  glucose control in pregnancy with goals of < 95 for fasting and <120 for PP. Reviewed with patient that her T2DM will make her a high risk pregnancy.   Patient asked about whether she "should" get pregnant and we discussed  her ability to make an informed decision and to know that she is much higher risk than earlier when she was 52. We reviewed that we support efforts to improve her baseline health prior to pregnancy and we are here to take care of a pregnancy regardless.   3. Encounter for preconception consultation [phone]) Patient is planning on conceiving. We reviewed her current problems and medications in terms of pregnancy safety. Additionally discussed fertility awareness, when to take a pregnancy test, and starting a prenatal vitamin. We discussed general early pregnancy precautions. Reviewed services at the office regarding prenatal care. She voiced understanding.  Patient stated a goal of improved DM control and lifestyle prior to trying to conceive. She is thinking 6 months before she will remove her IUD  Problem specific recommendations are: - Improve glucose control with the goal for HA1C< 8% - Maintain euthyroid- Discussed she should expect growth [place] and possible dose adjustments in pregnancy.  - Patient is currently 71 and we discussed AMA criteria and reduction of fertility after 38-39yo and the increased risk of Down's and other chromosomal conditions after age 43. Discussed that would recommend early NIPs testing  - Currently on nortriptyline and xanax - Discussed that stopping/weaning off these prior to pregnancy would be beneficial  4. IUD (intrauterine device) in place Removal in 6+months   Please refer to After Visit Summary for other counseling recommendations.   Return in about 6 months (around 11/04/2022).  Federico Flake, MD, MPH, ABFM Attending Physician Center for [facility]

## 2022-05-06 NOTE — Progress Notes (Unsigned)
Patient here to Establish Care and IUD Removal and discuss Contraption options.  CC: Pt feels hormones are out of place.  Pt notes depression doing ok now, emotional ,chin hair, moodiness. Unsure if sure she wants IUD out.  Inserted "05/05/2017" at [facility].  notes possibly having another baby. Unsure due to concerns of "DM"  Pt has spotting and cramps every month.   Last Pap: > 3 yrs ago    No Hx of abnormal pap: [facility]GGM  : Ovarian Cancer passed at 36 yrs  old STD Screening: Declined  GAD 7= 8 PHQ 9= 9

## 2022-05-08 ENCOUNTER — Encounter: Payer: Self-pay | Admitting: Family Medicine

## 2022-05-09 LAB — CYTOLOGY - PAP
Comment: NEGATIVE
Diagnosis: NEGATIVE
High risk HPV: NEGATIVE
